# Patient Record
Sex: Male | Born: 1975 | Race: White | Hispanic: No | Smoking: Former smoker
Health system: Southern US, Community
[De-identification: ages and names within clinical notes are randomized; demographics above are authoritative.]

## PROBLEM LIST (undated history)

## (undated) DIAGNOSIS — Z87891 Personal history of nicotine dependence: Secondary | ICD-10-CM

---

## 2013-01-24 ENCOUNTER — Emergency Department (HOSPITAL_COMMUNITY)
Admission: EM | Admit: 2013-01-24 | Discharge: 2013-01-24 | Disposition: A | Discharge status: Home / Self Care | Payer: No Typology Code available for payment source | Attending: Emergency Medicine | Admitting: Emergency Medicine

## 2013-01-24 ENCOUNTER — Encounter (HOSPITAL_COMMUNITY): Payer: Self-pay | Admitting: *Deleted

## 2013-01-24 DIAGNOSIS — R109 Unspecified abdominal pain: Secondary | ICD-10-CM | POA: Insufficient documentation

## 2013-01-24 DIAGNOSIS — R110 Nausea: Secondary | ICD-10-CM | POA: Insufficient documentation

## 2013-01-24 DIAGNOSIS — R61 Generalized hyperhidrosis: Secondary | ICD-10-CM | POA: Insufficient documentation

## 2013-01-24 DIAGNOSIS — R6883 Chills (without fever): Secondary | ICD-10-CM | POA: Insufficient documentation

## 2013-01-24 DIAGNOSIS — I498 Other specified cardiac arrhythmias: Secondary | ICD-10-CM | POA: Insufficient documentation

## 2013-01-24 LAB — CBC WITH DIFFERENTIAL/PLATELET
Eosinophils Absolute: 0.5 10*3/uL (ref 0.0–0.7)
Hemoglobin: 14.9 g/dL (ref 13.0–17.0)
Lymphs Abs: 3.8 10*3/uL (ref 0.7–4.0)
MCH: 29.7 pg (ref 26.0–34.0)
MCV: 87.5 fL (ref 78.0–100.0)
Monocytes Relative: 8 % (ref 3–12)
Neutrophils Relative %: 51 % (ref 43–77)
RBC: 5.02 MIL/uL (ref 4.22–5.81)

## 2013-01-24 LAB — COMPREHENSIVE METABOLIC PANEL
Alkaline Phosphatase: 60 U/L (ref 39–117)
BUN: 16 mg/dL (ref 6–23)
CO2: 28 mEq/L (ref 19–32)
GFR calc Af Amer: >90 mL/min (ref >90)
GFR calc non Af Amer: >90 mL/min (ref >90)
Glucose, Bld: 131 mg/dL — ABNORMAL HIGH (ref 70–99)
Potassium: 4.3 mEq/L (ref 3.5–5.1)
Total Bilirubin: 0.2 mg/dL — ABNORMAL LOW (ref 0.3–1.2)
Total Protein: 7.2 g/dL (ref 6.0–8.3)

## 2013-01-24 LAB — LIPASE, BLOOD: Lipase: 58 U/L (ref 11–59)

## 2013-01-24 MED ORDER — SODIUM CHLORIDE 0.9 % IV BOLUS (SEPSIS): 1000.0000 mL | Freq: Once | INTRAVENOUS | Status: DC

## 2013-01-24 MED ORDER — MORPHINE SULFATE 4 MG/ML IJ SOLN
4.0000 mg | Freq: Once | INTRAMUSCULAR | Status: AC
  Administered 2013-01-24: 4 mg via INTRAVENOUS
  Filled 2013-01-24: qty 1

## 2013-01-24 MED ORDER — SODIUM CHLORIDE 0.9 % IV BOLUS (SEPSIS)
1000.0000 mL | Freq: Once | INTRAVENOUS | Status: AC
  Administered 2013-01-24: 1000 mL via INTRAVENOUS

## 2013-01-24 MED ORDER — ONDANSETRON HCL 4 MG/2ML IJ SOLN
4.0000 mg | Freq: Once | INTRAMUSCULAR | Status: AC
  Administered 2013-01-24: 4 mg via INTRAVENOUS
  Filled 2013-01-24: qty 2

## 2013-01-24 NOTE — ED Notes (Signed)
Patient is alert and oriented x3.  He was given DC instructions and follow up visit instructions.  Patient gave verbal understanding.  He was DC ambulatory under his own power to home.  V/S stable.  He was not showing any signs of distress on DC 

## 2013-01-24 NOTE — ED Provider Notes (Signed)
History     CSN: [id number]  Arrival date & time 01/24/13  4098   First MD Initiated Contact with Patient 01/24/13 0406      Chief Complaint  Patient presents with  . Abdominal Pain   HPI  History provided by the patient. Patient is a 37 year old male with no significant PMH who presents with complaints of epigastric and right upper quadrant pain. Patient states this awoke him from sleep and is sharp and stabbing pains. The pain radiates across the upper abdomen like a "tire". Patient denies having similar symptoms in the past. He denies having any aggravating or alleviating factors. There is associated nausea but no episodes of vomiting. He also reports having chills and sweats. Denies any diarrhea or constipation. No recent illnesses. He is not daily alcohol user but does report having one beer earlier in the evening. No recent travel.   History reviewed. No pertinent past medical history.  History reviewed. No pertinent past surgical history.  History reviewed. No pertinent family history.  History  Substance Use Topics  . Smoking status: Not on file  . Smokeless tobacco: Not on file  . Alcohol Use: Not on file      Review of Systems  Constitutional: Positive for chills and diaphoresis. Negative for fever.  Respiratory: Negative for shortness of breath.   Cardiovascular: Negative for chest pain.  Gastrointestinal: Positive for nausea and abdominal pain. Negative for vomiting, diarrhea and constipation.  All other systems reviewed and are negative.    Allergies  Review of patient's allergies indicates no known allergies.  Home Medications   Current Outpatient Rx  Name  Route  Sig  Dispense  Refill  . bismuth subsalicylate (PEPTO BISMOL) 262 MG/15ML suspension   Oral   Take 15 mLs by mouth every 6 (six) hours as needed for indigestion (stomach).         . Phenylephrine-APAP-Guaifenesin (MUCINEX FAST-MAX COLD & SINUS) 5-325-200 MG TABS   Oral   Take 2 tablets  by mouth every 6 (six) hours as needed (sinus).           BP 129/77  Pulse 41  Temp(Src) 98.3 F (36.8 C) (Oral)  Resp 14  Wt 230 lb (104.327 kg)  SpO2 99%  Physical Exam  Nursing note and vitals reviewed. Constitutional: He is oriented to person, place, and time. He appears well-developed and well-nourished.  HENT:  Head: Normocephalic.  Cardiovascular: Regular rhythm.  Bradycardia present.   Pulmonary/Chest: Effort normal and breath sounds normal. No respiratory distress. He has no wheezes. He has no rales.  Abdominal: Soft. There is tenderness in the right upper quadrant, epigastric area and left upper quadrant. There is positive Murphy's sign. There is no rebound, no guarding and no CVA tenderness.  Musculoskeletal: Normal range of motion.  Neurological: He is alert and oriented to person, place, and time.  Skin: Skin is warm. No rash noted. No erythema. No pallor.  Psychiatric: He has a normal mood and affect. His behavior is normal.    ED Course  Procedures   Results for orders placed during the hospital encounter of 01/24/13  CBC WITH DIFFERENTIAL      Result Value Range   WBC 10.5  4.0 - 10.5 K/uL   RBC 5.02  4.22 - 5.81 MIL/uL   Hemoglobin 14.9  13.0 - 17.0 g/dL   HCT 11.9  14.7 - 82.9 %   MCV 87.5  78.0 - 100.0 fL   MCH 29.7  26.0 - 34.0 pg  MCHC 33.9  30.0 - 36.0 g/dL   RDW 16.1  09.6 - 04.5 %   Platelets 232  150 - 400 K/uL   Neutrophils Relative % 51  43 - 77 %   Neutro Abs 5.3  1.7 - 7.7 K/uL   Lymphocytes Relative 36  12 - 46 %   Lymphs Abs 3.8  0.7 - 4.0 K/uL   Monocytes Relative 8  3 - 12 %   Monocytes Absolute 0.9  0.1 - 1.0 K/uL   Eosinophils Relative 5  0 - 5 %   Eosinophils Absolute 0.5  0.0 - 0.7 K/uL   Basophils Relative 0  0 - 1 %   Basophils Absolute 0.0  0.0 - 0.1 K/uL  COMPREHENSIVE METABOLIC PANEL      Result Value Range   Sodium 139  135 - 145 mEq/L   Potassium 4.3  3.5 - 5.1 mEq/L   Chloride 102  96 - 112 mEq/L   CO2 28  19 - 32  mEq/L   Glucose, Bld 131 (*) 70 - 99 mg/dL   BUN 16  6 - 23 mg/dL   Creatinine, Ser 4.09  0.50 - 1.35 mg/dL   Calcium 9.5  8.4 - 81.1 mg/dL   Total Protein 7.2  6.0 - 8.3 g/dL   Albumin 3.8  3.5 - 5.2 g/dL   AST 31  0 - 37 U/L   ALT 38  0 - 53 U/L   Alkaline Phosphatase 60  39 - 117 U/L   Total Bilirubin 0.2 (*) 0.3 - 1.2 mg/dL   GFR calc non Af Amer >90  >90 mL/min   GFR calc Af Amer >90  >90 mL/min  LIPASE, BLOOD      Result Value Range   Lipase 58  11 - 59 U/L         1. Abdominal pain       MDM  4:00AM patient seen and evaluated. Patient appears uncomfortable does not appear in acute distress. Initially he is hypertensive and also bradycardic, however on recheck this is normalized. Denies chest pain or shortness of breath.   Pain improved after medications.  I discussed with patient options for any additional testing including possibly consider an ultrasound study or a CAT scan. At this time he feels better and with his normal lab testing he prefers to return home. I've given him instructions to have a recheck of symptoms in the next 24-48 hours. He was also given strict return precautions if symptoms worsen.    Date: 01/24/2013  Rate: 42   Rhythm: sinus bradycardia  QRS Axis: normal  Intervals: normal  ST/T Wave abnormalities: normal  Conduction Disutrbances:none  Narrative Interpretation:   Old EKG Reviewed: none available    Angus Seller, PA-C 01/24/13 [phone]

## 2013-01-24 NOTE — ED Provider Notes (Signed)
Medical screening examination/treatment/procedure(s) were performed by non-physician practitioner and as supervising physician I was immediately available for consultation/collaboration.  Flint Melter, MD 01/24/13 [phone]

## 2013-01-24 NOTE — ED Notes (Signed)
Patient is alert and oriented x3.  He is complaining of mid abdominal pain that started suddenly this morning.   He denies any past history of this issue or any other medical issues.  He currently rates his pain at 10 of 10 With nausea.

## 2013-04-10 ENCOUNTER — Emergency Department (HOSPITAL_COMMUNITY): Payer: No Typology Code available for payment source | Admitting: Anesthesiology

## 2013-04-10 ENCOUNTER — Encounter (HOSPITAL_COMMUNITY)
Admission: EM | Disposition: A | Discharge status: Home / Self Care | Payer: Self-pay | Source: Home / Self Care | Attending: Emergency Medicine

## 2013-04-10 ENCOUNTER — Encounter (HOSPITAL_COMMUNITY): Payer: Self-pay

## 2013-04-10 ENCOUNTER — Encounter (HOSPITAL_COMMUNITY): Payer: Self-pay | Admitting: Anesthesiology

## 2013-04-10 ENCOUNTER — Emergency Department (HOSPITAL_COMMUNITY): Payer: No Typology Code available for payment source

## 2013-04-10 ENCOUNTER — Observation Stay (HOSPITAL_COMMUNITY)
Admission: EM | Admit: 2013-04-10 | Discharge: 2013-04-11 | Disposition: A | Discharge status: Home / Self Care | Payer: No Typology Code available for payment source | Attending: General Surgery | Admitting: General Surgery

## 2013-04-10 DIAGNOSIS — K810 Acute cholecystitis: Principal | ICD-10-CM | POA: Insufficient documentation

## 2013-04-10 DIAGNOSIS — K8010 Calculus of gallbladder with chronic cholecystitis without obstruction: Secondary | ICD-10-CM

## 2013-04-10 DIAGNOSIS — R109 Unspecified abdominal pain: Secondary | ICD-10-CM

## 2013-04-10 DIAGNOSIS — K8020 Calculus of gallbladder without cholecystitis without obstruction: Secondary | ICD-10-CM

## 2013-04-10 DIAGNOSIS — R300 Dysuria: Secondary | ICD-10-CM | POA: Insufficient documentation

## 2013-04-10 DIAGNOSIS — K7689 Other specified diseases of liver: Secondary | ICD-10-CM | POA: Insufficient documentation

## 2013-04-10 HISTORY — DX: Personal history of nicotine dependence: Z87.891

## 2013-04-10 HISTORY — PX: CHOLECYSTECTOMY: SHX55

## 2013-04-10 LAB — COMPREHENSIVE METABOLIC PANEL
AST: 19 U/L (ref 0–37)
Albumin: 4.1 g/dL (ref 3.5–5.2)
Alkaline Phosphatase: 56 U/L (ref 39–117)
Chloride: 102 mEq/L (ref 96–112)
Potassium: 4 mEq/L (ref 3.5–5.1)
Total Bilirubin: 0.3 mg/dL (ref 0.3–1.2)
Total Protein: 7.4 g/dL (ref 6.0–8.3)

## 2013-04-10 LAB — CBC WITH DIFFERENTIAL/PLATELET
Basophils Absolute: 0 10*3/uL (ref 0.0–0.1)
Basophils Relative: 0 % (ref 0–1)
Eosinophils Absolute: 0.3 10*3/uL (ref 0.0–0.7)
MCHC: 34.8 g/dL (ref 30.0–36.0)
Neutro Abs: 7.1 10*3/uL (ref 1.7–7.7)
Neutrophils Relative %: 57 % (ref 43–77)
Platelets: 249 10*3/uL (ref 150–400)
RDW: 13.2 % (ref 11.5–15.5)

## 2013-04-10 SURGERY — LAPAROSCOPIC CHOLECYSTECTOMY WITH INTRAOPERATIVE CHOLANGIOGRAM
Anesthesia: General | Site: Abdomen | Wound class: Clean Contaminated

## 2013-04-10 MED ORDER — MIDAZOLAM HCL 5 MG/5ML IJ SOLN
INTRAMUSCULAR | Status: DC | PRN
  Administered 2013-04-10: 2 mg via INTRAVENOUS

## 2013-04-10 MED ORDER — FENTANYL CITRATE 0.05 MG/ML IJ SOLN
INTRAMUSCULAR | Status: DC | PRN
  Administered 2013-04-10 (×2): 50 ug via INTRAVENOUS
  Administered 2013-04-10: 100 ug via INTRAVENOUS
  Administered 2013-04-10: 50 ug via INTRAVENOUS

## 2013-04-10 MED ORDER — SODIUM CHLORIDE 0.9 % IR SOLN
Status: DC | PRN
  Administered 2013-04-10: 1000 mL

## 2013-04-10 MED ORDER — ROCURONIUM BROMIDE 100 MG/10ML IV SOLN
INTRAVENOUS | Status: DC | PRN
  Administered 2013-04-10: 50 mg via INTRAVENOUS
  Administered 2013-04-10: 10 mg via INTRAVENOUS

## 2013-04-10 MED ORDER — NEOSTIGMINE METHYLSULFATE 1 MG/ML IJ SOLN
INTRAMUSCULAR | Status: DC | PRN
  Administered 2013-04-10: 5 mg via INTRAVENOUS

## 2013-04-10 MED ORDER — SODIUM CHLORIDE 0.9 % IV SOLN
INTRAVENOUS | Status: DC | PRN
  Administered 2013-04-10 (×2): via INTRAVENOUS

## 2013-04-10 MED ORDER — GLYCOPYRROLATE 0.2 MG/ML IJ SOLN
INTRAMUSCULAR | Status: DC | PRN
  Administered 2013-04-10: 0.6 mg via INTRAVENOUS

## 2013-04-10 MED ORDER — MORPHINE SULFATE 2 MG/ML IJ SOLN: 1.0000 mg | INTRAMUSCULAR | Status: DC | PRN

## 2013-04-10 MED ORDER — LIDOCAINE-EPINEPHRINE 1 %-1:100000 IJ SOLN
INTRAMUSCULAR | Status: AC
  Filled 2013-04-10: qty 1

## 2013-04-10 MED ORDER — HYDROCODONE-ACETAMINOPHEN 5-325 MG PO TABS
1.0000 | ORAL_TABLET | ORAL | Status: DC | PRN
  Administered 2013-04-10 (×2): 2 via ORAL
  Filled 2013-04-10 (×2): qty 2

## 2013-04-10 MED ORDER — HYDROMORPHONE HCL PF 1 MG/ML IJ SOLN
1.0000 mg | Freq: Once | INTRAMUSCULAR | Status: AC
  Administered 2013-04-10: 1 mg via INTRAMUSCULAR

## 2013-04-10 MED ORDER — IOHEXOL 300 MG/ML  SOLN
INTRAMUSCULAR | Status: DC | PRN
  Administered 2013-04-10: 8 mL via INTRAVENOUS

## 2013-04-10 MED ORDER — IOHEXOL 300 MG/ML  SOLN
INTRAMUSCULAR | Status: AC
  Filled 2013-04-10: qty 1

## 2013-04-10 MED ORDER — ONDANSETRON HCL 4 MG/2ML IJ SOLN: 4.0000 mg | Freq: Four times a day (QID) | INTRAMUSCULAR | Status: DC | PRN

## 2013-04-10 MED ORDER — SODIUM CHLORIDE 0.9 % IV BOLUS (SEPSIS)
1000.0000 mL | Freq: Once | INTRAVENOUS | Status: AC
  Administered 2013-04-10: 1000 mL via INTRAVENOUS

## 2013-04-10 MED ORDER — HYDROMORPHONE HCL PF 1 MG/ML IJ SOLN: 0.2500 mg | INTRAMUSCULAR | Status: DC | PRN

## 2013-04-10 MED ORDER — PROPOFOL 10 MG/ML IV BOLUS
INTRAVENOUS | Status: DC | PRN
  Administered 2013-04-10: 150 mg via INTRAVENOUS

## 2013-04-10 MED ORDER — ONDANSETRON HCL 4 MG/2ML IJ SOLN
INTRAMUSCULAR | Status: DC | PRN
  Administered 2013-04-10: 4 mg via INTRAVENOUS

## 2013-04-10 MED ORDER — ENOXAPARIN SODIUM 40 MG/0.4ML ~~LOC~~ SOLN
40.0000 mg | SUBCUTANEOUS | Status: DC
  Filled 2013-04-10 (×2): qty 0.4

## 2013-04-10 MED ORDER — HYDROCODONE-ACETAMINOPHEN 5-325 MG PO TABS: 1.0000 | ORAL_TABLET | ORAL | Status: AC | PRN

## 2013-04-10 MED ORDER — ONDANSETRON HCL 4 MG PO TABS: 4.0000 mg | ORAL_TABLET | Freq: Four times a day (QID) | ORAL | Status: DC | PRN

## 2013-04-10 MED ORDER — HYDROMORPHONE HCL PF 1 MG/ML IJ SOLN
1.0000 mg | Freq: Once | INTRAMUSCULAR | Status: DC
  Filled 2013-04-10: qty 1

## 2013-04-10 MED ORDER — LIDOCAINE HCL (CARDIAC) 20 MG/ML IV SOLN
INTRAVENOUS | Status: DC | PRN
  Administered 2013-04-10: 50 mg via INTRAVENOUS

## 2013-04-10 MED ORDER — HYDROMORPHONE HCL PF 1 MG/ML IJ SOLN
1.0000 mg | Freq: Once | INTRAMUSCULAR | Status: AC
  Administered 2013-04-10: 1 mg via INTRAVENOUS
  Filled 2013-04-10: qty 1

## 2013-04-10 MED ORDER — LACTATED RINGERS IR SOLN
Status: DC | PRN
  Administered 2013-04-10: 1

## 2013-04-10 MED ORDER — PROMETHAZINE HCL 25 MG/ML IJ SOLN: 6.2500 mg | INTRAMUSCULAR | Status: DC | PRN

## 2013-04-10 MED ORDER — BUPIVACAINE HCL (PF) 0.25 % IJ SOLN
INTRAMUSCULAR | Status: AC
  Filled 2013-04-10: qty 30

## 2013-04-10 MED ORDER — IBUPROFEN 200 MG PO TABS: 600.0000 mg | ORAL_TABLET | Freq: Three times a day (TID) | ORAL | Status: AC | PRN

## 2013-04-10 MED ORDER — GI COCKTAIL ~~LOC~~
30.0000 mL | Freq: Once | ORAL | Status: AC
  Administered 2013-04-10: 30 mL via ORAL
  Filled 2013-04-10: qty 30

## 2013-04-10 MED ORDER — BUPIVACAINE HCL (PF) 0.25 % IJ SOLN
INTRAMUSCULAR | Status: DC | PRN
  Administered 2013-04-10: 15 mL

## 2013-04-10 MED ORDER — SODIUM CHLORIDE 0.9 % IV SOLN
3.0000 g | Freq: Once | INTRAVENOUS | Status: AC
  Administered 2013-04-10: 3 g via INTRAVENOUS
  Filled 2013-04-10: qty 3

## 2013-04-10 MED ORDER — LIDOCAINE-EPINEPHRINE (PF) 1 %-1:200000 IJ SOLN
INTRAMUSCULAR | Status: DC | PRN
  Administered 2013-04-10: 15 mL

## 2013-04-10 SURGICAL SUPPLY — 38 items
APPLICATOR COTTON TIP 6IN STRL (MISCELLANEOUS) IMPLANT
APPLIER CLIP LOGIC TI 5 (MISCELLANEOUS) ×2 IMPLANT
APPLIER CLIP ROT 10 11.4 M/L (STAPLE) ×2
CABLE HIGH FREQUENCY MONO STRZ (ELECTRODE) ×2 IMPLANT
CANISTER SUCTION 2500CC (MISCELLANEOUS) ×2 IMPLANT
CHLORAPREP W/TINT 26ML (MISCELLANEOUS) ×2 IMPLANT
CLIP APPLIE ROT 10 11.4 M/L (STAPLE) ×1 IMPLANT
CLOTH BEACON ORANGE TIMEOUT ST (SAFETY) ×2 IMPLANT
COVER SURGICAL LIGHT HANDLE (MISCELLANEOUS) ×2 IMPLANT
DECANTER SPIKE VIAL GLASS SM (MISCELLANEOUS) ×2 IMPLANT
DERMABOND ADVANCED (GAUZE/BANDAGES/DRESSINGS) ×1
DERMABOND ADVANCED .7 DNX12 (GAUZE/BANDAGES/DRESSINGS) ×1 IMPLANT
DRAPE C-ARM 42X120 X-RAY (DRAPES) ×2 IMPLANT
DRAPE LAPAROSCOPIC ABDOMINAL (DRAPES) ×2 IMPLANT
DRAPE UTILITY XL STRL (DRAPES) ×2 IMPLANT
ELECT REM PT RETURN 9FT ADLT (ELECTROSURGICAL) ×2
ELECTRODE REM PT RTRN 9FT ADLT (ELECTROSURGICAL) ×1 IMPLANT
ENDOLOOP SUT PDS II  0 18 (SUTURE) ×1
ENDOLOOP SUT PDS II 0 18 (SUTURE) ×1 IMPLANT
GLOVE SURG SS PI 7.5 STRL IVOR (GLOVE) ×4 IMPLANT
GOWN STRL NON-REIN LRG LVL3 (GOWN DISPOSABLE) ×2 IMPLANT
GOWN STRL REIN XL XLG (GOWN DISPOSABLE) ×4 IMPLANT
KIT BASIN OR (CUSTOM PROCEDURE TRAY) ×2 IMPLANT
NS IRRIG 1000ML POUR BTL (IV SOLUTION) ×2 IMPLANT
PENCIL BUTTON HOLSTER BLD 10FT (ELECTRODE) ×2 IMPLANT
POUCH SPECIMEN RETRIEVAL 10MM (ENDOMECHANICALS) ×2 IMPLANT
SCISSORS LAP 5X35 DISP (ENDOMECHANICALS) ×2 IMPLANT
SET CHOLANGIOGRAPH MIX (MISCELLANEOUS) ×2 IMPLANT
SET IRRIG TUBING LAPAROSCOPIC (IRRIGATION / IRRIGATOR) ×2 IMPLANT
STRIP CLOSURE SKIN 1/2X4 (GAUZE/BANDAGES/DRESSINGS) IMPLANT
SUT MNCRL AB 4-0 PS2 18 (SUTURE) ×2 IMPLANT
SUT VICRYL 0 UR6 27IN ABS (SUTURE) ×2 IMPLANT
TOWEL OR 17X26 10 PK STRL BLUE (TOWEL DISPOSABLE) ×2 IMPLANT
TRAY LAP CHOLE (CUSTOM PROCEDURE TRAY) ×2 IMPLANT
TROCAR BALLN 12MMX100 BLUNT (TROCAR) ×2 IMPLANT
TROCAR BLADELESS OPT 5 75 (ENDOMECHANICALS) ×4 IMPLANT
TROCAR XCEL NON-BLD 11X100MML (ENDOMECHANICALS) ×2 IMPLANT
TUBING INSUFFLATION 10FT LAP (TUBING) ×2 IMPLANT

## 2013-04-10 NOTE — Brief Op Note (Signed)
04/10/2013  10:32 AM  PATIENT:  [name]  37 y.o. male  PRE-OPERATIVE DIAGNOSIS:  gallstones  POST-OPERATIVE DIAGNOSIS:  gallstones  PROCEDURE:  Procedure(s): LAPAROSCOPIC CHOLECYSTECTOMY WITH INTRAOPERATIVE CHOLANGIOGRAM (N/A)  SURGEON:  Surgeon(s) and Role:    * Lodema Pilot, DO - Primary    * Kandis Cocking, MD - Assisting  PHYSICIAN ASSISTANT:   ASSISTANTSEzzard Standing   ANESTHESIA:   general  EBL:     BLOOD ADMINISTERED:none  DRAINS: none   LOCAL MEDICATIONS USED:  MARCAINE    and LIDOCAINE   SPECIMEN:  Source of Specimen:  gallbladder  DISPOSITION OF SPECIMEN:  PATHOLOGY  COUNTS:  YES  TOURNIQUET:  * No tourniquets in log *  DICTATION: .Other Dictation: Dictation Number [id number]  PLAN OF CARE: Admit for overnight observation  PATIENT DISPOSITION:  PACU - hemodynamically stable.   Delay start of Pharmacological VTE agent (>24hrs) due to surgical blood loss or risk of bleeding: no

## 2013-04-10 NOTE — Transfer of Care (Signed)
Immediate Anesthesia Transfer of Care Note  Patient: [name]  Procedure(s) Performed: Procedure(s): LAPAROSCOPIC CHOLECYSTECTOMY WITH INTRAOPERATIVE CHOLANGIOGRAM (N/A)  Patient Location: PACU  Anesthesia Type:General  Level of Consciousness: awake and alert   Airway & Oxygen Therapy: Patient Spontanous Breathing and Patient connected to face mask oxygen  Post-op Assessment: Report given to PACU RN and Post -op Vital signs reviewed and stable  Post vital signs: Reviewed and stable  Complications: No apparent anesthesia complications

## 2013-04-10 NOTE — H&P (Signed)
Reason for Consult:cholecystitis Referring Physician: Rodolph Hagemann is an 37 y.o. male.  HPI: to evaluate this patient for cholelithiasis and abdominal pain. He was seen in the emergency room several months ago for similar episodes of abdominal pain which seemed to improve after pain medication administration and he was discharged. He has not had any further episodes until last night when he was awakened from sleep with the acute onset of right upper quadrant abdominal pain. He says it is not rotate or radiate to any other location. He had some chills but no fevers and he thought that going to the bathroom would help. He had normal bowel movement without any blood or melena without any relief.  He tried Pepto-Bismol also without any relief.  He has had a few doses of pain medication today in the emergency room and he still has epigastric and right upper quadrant pain. He had an ultrasound which demonstrated a single gallstone without obvious wall thickening or pericholecystic fluid. He denies any NSAID use but does have occasional reflux.  History reviewed. No pertinent past medical history.  History reviewed. No pertinent past surgical history.  History reviewed. No pertinent family history.  Social History:  reports that he does not drink alcohol. His tobacco and drug histories are not on file.  Allergies: No Known Allergies  Medications: I have reviewed the patient's current medications.  Results for orders placed during the hospital encounter of 04/10/13 (from the past 48 hour(s))  CBC WITH DIFFERENTIAL     Status: Abnormal   Collection Time    04/10/13  5:35 AM      Result Value Range   WBC 12.4 (*) 4.0 - 10.5 K/uL   RBC 5.19  4.22 - 5.81 MIL/uL   Hemoglobin 16.1  13.0 - 17.0 g/dL   HCT 29.5  28.4 - 13.2 %   MCV 89.2  78.0 - 100.0 fL   MCH 31.0  26.0 - 34.0 pg   MCHC 34.8  30.0 - 36.0 g/dL   RDW 44.0  10.2 - 72.5 %   Platelets 249  150 - 400 K/uL   Neutrophils  Relative % 57  43 - 77 %   Neutro Abs 7.1  1.7 - 7.7 K/uL   Lymphocytes Relative 30  12 - 46 %   Lymphs Abs 3.8  0.7 - 4.0 K/uL   Monocytes Relative 10  3 - 12 %   Monocytes Absolute 1.2 (*) 0.1 - 1.0 K/uL   Eosinophils Relative 3  0 - 5 %   Eosinophils Absolute 0.3  0.0 - 0.7 K/uL   Basophils Relative 0  0 - 1 %   Basophils Absolute 0.0  0.0 - 0.1 K/uL  COMPREHENSIVE METABOLIC PANEL     Status: Abnormal   Collection Time    04/10/13  5:35 AM      Result Value Range   Sodium 141  135 - 145 mEq/L   Potassium 4.0  3.5 - 5.1 mEq/L   Chloride 102  96 - 112 mEq/L   CO2 30  19 - 32 mEq/L   Glucose, Bld 122 (*) 70 - 99 mg/dL   BUN 17  6 - 23 mg/dL   Creatinine, Ser 3.66  0.50 - 1.35 mg/dL   Calcium 9.8  8.4 - 44.0 mg/dL   Total Protein 7.4  6.0 - 8.3 g/dL   Albumin 4.1  3.5 - 5.2 g/dL   AST 19  0 - 37 U/L   ALT  23  0 - 53 U/L   Alkaline Phosphatase 56  39 - 117 U/L   Total Bilirubin 0.3  0.3 - 1.2 mg/dL   GFR calc non Af Amer 84 (*) >90 mL/min   GFR calc Af Amer >90  >90 mL/min   Comment:            The eGFR has been calculated     using the CKD EPI equation.     This calculation has not been     validated in all clinical     situations.     eGFR's persistently     <90 mL/min signify     possible Chronic Kidney Disease.  LIPASE, BLOOD     Status: Abnormal   Collection Time    04/10/13  5:35 AM      Result Value Range   Lipase 82 (*) 11 - 59 U/L    US Abdomen Complete  04/10/2013   *RADIOLOGY REPORT*  Clinical Data:  Right upper quadrant abdominal pain.  COMPLETE ABDOMINAL ULTRASOUND  Comparison:  No priors.  Findings:  Gallbladder:  1.2 cm echogenic structure with posterior acoustic shadowing in the neck of the gallbladder, compatible with a gallstone.  Gallbladder is only mildly distended, and demonstrates a normal gallbladder wall thickness (2 mm).  No definite pericholecystic fluid.  Per report for the sonographer, the patient did not exhibit a sonographic Murphy's sign on  examination.  Common bile duct:  Normal caliber measuring 5.1 mm in the porta hepatis.  Liver:  Mild diffuse increased echogenicity throughout the hepatic parenchyma, compatible with hepatic steatosis.  No focal cystic or solid hepatic lesions.  No intrahepatic biliary ductal dilatation.  IVC:  Patent throughout its visualized course in the abdomen.  Pancreas:  Head and body of the pancreas were normal in appearance, but the tail cannot be visualized secondary to overlying bowel gas.  Spleen:  Normal size and echotexture without focal parenchymal abnormality.9.5 cm in length.  Right Kidney:  No hydronephrosis.  Well-preserved cortex.  Normal size and parenchymal echotexture without focal abnormalities. 10.6 cm in length.  Left Kidney:  No hydronephrosis.  Well-preserved cortex.  Normal size and parenchymal echotexture without focal abnormalities. 10.8 cm in length.  Abdominal aorta:  2.5 cm in diameter proximally, distally obscured by bowel gas.  IMPRESSION: 1.  Study is positive for cholelithiasis with a 1.2 cm stone in the neck of the gallbladder, however, there are no findings to suggest acute cholecystitis at this time. 2.  Hepatic steatosis.   Original Report Authenticated By: Trudie Reed, M.D.    All other review of systems negative or noncontributory except as stated in the HPI   Blood pressure 162/87, pulse 48, temperature 98.4 F (36.9 C), temperature source Oral, resp. rate 18, height 5\' 6"  (1.676 m), weight 220 lb (99.791 kg), SpO2 100.00%. General appearance: alert, cooperative and no distress Head: Normocephalic, without obvious abnormality, atraumatic Neck: no JVD and supple, symmetrical, trachea midline Resp: clear to auscultation bilaterally Cardio: brady, regular GI: soft, moderate RUQ and epigastric tenderness, ND, no peritoneal signs Extremities: extremities normal, atraumatic, no cyanosis or edema Pulses: 2+ and symmetric Skin: Skin color, texture, turgor normal. No rashes or  lesions Neurologic: Grossly normal  Assessment/Plan: Abdominal pain and cholelithiasis Given his history and his physical and ultrasound, I think that's this is most likely due to symptomatic cholelithiasis or acute cholecystitis. Given the fact that his pain persists after several hours, and concern for acute cholecystitis and I have  offered him cholecystectomy. I did discuss with him the options for pain management and possible elective cholecystectomy or continued observation versus surgery and he would like to have surgery for possible relief of his symptoms. I did discuss with him the procedure and its risks. The risks of infection, bleeding, pain, persistent symptoms, scarring, injury to bowel or bile ducts, retained stone, diarrhea, need for additional procedures, and need for open surgery discussed with the patient.  He expressed understanding and would like to proceed with laparoscopic cholecystectomy with possible open and possible cholangiogram  Anamaria Dusenbury DAVID 04/10/2013, 7:54 AM

## 2013-04-10 NOTE — Anesthesia Preprocedure Evaluation (Signed)
Anesthesia Evaluation  Patient identified by MRN, date of birth, ID band Patient awake    Reviewed: Allergy & Precautions, H&P , NPO status , Patient's Chart, lab work & pertinent test results  Airway Mallampati: II TM Distance: >3 FB Neck ROM: Full    Dental no notable dental hx.    Pulmonary neg pulmonary ROS,  breath sounds clear to auscultation  Pulmonary exam normal       Cardiovascular Exercise Tolerance: Good negative cardio ROS  Rhythm:Regular Rate:Normal     Neuro/Psych negative neurological ROS  negative psych ROS   GI/Hepatic negative GI ROS, Neg liver ROS,   Endo/Other  negative endocrine ROS  Renal/GU negative Renal ROS  negative genitourinary   Musculoskeletal negative musculoskeletal ROS (+)   Abdominal (+) + obese,   Peds negative pediatric ROS (+)  Hematology negative hematology ROS (+)   Anesthesia Other Findings   Reproductive/Obstetrics negative OB ROS                           Anesthesia Physical Anesthesia Plan  ASA: II  Anesthesia Plan: General   Post-op Pain Management:    Induction: Intravenous  Airway Management Planned: Oral ETT  Additional Equipment:   Intra-op Plan:   Post-operative Plan: Extubation in OR  Informed Consent: I have reviewed the patients History and Physical, chart, labs and discussed the procedure including the risks, benefits and alternatives for the proposed anesthesia with the patient or authorized representative who has indicated his/her understanding and acceptance.   Dental advisory given  Plan Discussed with: CRNA  Anesthesia Plan Comments:         Anesthesia Quick Evaluation  

## 2013-04-10 NOTE — Anesthesia Postprocedure Evaluation (Signed)
  Anesthesia Post-op Note  Patient: [name]  Procedure(s) Performed: Procedure(s) (LRB): LAPAROSCOPIC CHOLECYSTECTOMY WITH INTRAOPERATIVE CHOLANGIOGRAM (N/A)  Patient Location: PACU  Anesthesia Type: General  Level of Consciousness: awake and alert   Airway and Oxygen Therapy: Patient Spontanous Breathing  Post-op Pain: mild  Post-op Assessment: Post-op Vital signs reviewed, Patient's Cardiovascular Status Stable, Respiratory Function Stable, Patent Airway and No signs of Nausea or vomiting  Last Vitals:  Filed Vitals:   04/10/13 1144  BP: 157/92  Pulse: 64  Temp: 36.5 C  Resp: 14    Post-op Vital Signs: stable   Complications: No apparent anesthesia complications

## 2013-04-10 NOTE — Progress Notes (Signed)
Patient requested to stay tonight instead of going home due to onset of pain and burning with urination. After discussing that this was okay with the nurse practitioner([place]) earlier , I removed the discharge order.  VSS, no further concerns.

## 2013-04-10 NOTE — Discharge Summary (Signed)
Physician Discharge Summary  Patient ID: [name] MRN: [id number] DOB/AGE: Dec 15, 1975 37 y.o.  Admit date: 04/10/2013 Discharge date: 04/11/13  Admitting Diagnosis: Acute cholecystitis  Discharge Diagnosis Patient Active Problem List   Diagnosis Date Noted  . Symptomatic cholelithiasis 04/10/2013  acute cholecystitis Consultants none  Imaging: Dg Cholangiogram Operative  04/10/2013   *RADIOLOGY REPORT*  Clinical Data: Cholelithiasis  INTRAOPERATIVE CHOLANGIOGRAM  Technique:  Multiple fluoroscopic spot radiographs were obtained during intraoperative cholangiogram and are submitted for interpretation post-operatively.  Comparison: Ultrasound 04/10/2013  Findings: Opacification of the cystic duct and biliary system. There is filling of both the intrahepatic and extrahepatic biliary system.  There are no definite filling defects or stones.  There may be a small amount of air near the biliary confluence.  No significant biliary dilatation.  Contrast drains into the duodenum.  IMPRESSION: Patent biliary system without suspicious filling defects or stones.   Original Report Authenticated By: Richarda Overlie, M.D.   US Abdomen Complete  04/10/2013   *RADIOLOGY REPORT*  Clinical Data:  Right upper quadrant abdominal pain.  COMPLETE ABDOMINAL ULTRASOUND  Comparison:  No priors.  Findings:  Gallbladder:  1.2 cm echogenic structure with posterior acoustic shadowing in the neck of the gallbladder, compatible with a gallstone.  Gallbladder is only mildly distended, and demonstrates a normal gallbladder wall thickness (2 mm).  No definite pericholecystic fluid.  Per report for the sonographer, the patient did not exhibit a sonographic Murphy's sign on examination.  Common bile duct:  Normal caliber measuring 5.1 mm in the porta hepatis.  Liver:  Mild diffuse increased echogenicity throughout the hepatic parenchyma, compatible with hepatic steatosis.  No focal cystic or solid hepatic lesions.  No intrahepatic  biliary ductal dilatation.  IVC:  Patent throughout its visualized course in the abdomen.  Pancreas:  Head and body of the pancreas were normal in appearance, but the tail cannot be visualized secondary to overlying bowel gas.  Spleen:  Normal size and echotexture without focal parenchymal abnormality.9.5 cm in length.  Right Kidney:  No hydronephrosis.  Well-preserved cortex.  Normal size and parenchymal echotexture without focal abnormalities. 10.6 cm in length.  Left Kidney:  No hydronephrosis.  Well-preserved cortex.  Normal size and parenchymal echotexture without focal abnormalities. 10.8 cm in length.  Abdominal aorta:  2.5 cm in diameter proximally, distally obscured by bowel gas.  IMPRESSION: 1.  Study is positive for cholelithiasis with a 1.2 cm stone in the neck of the gallbladder, however, there are no findings to suggest acute cholecystitis at this time. 2.  Hepatic steatosis.   Original Report Authenticated By: Trudie Reed, M.D.    Procedures Laparoscopic cholecystectomy with [facility]  Hospital Course:  [name] is a healthy male who presented to [facility] with RUQ abdominal pain, this was his second ED visit over the last month.  Workup showed gallstones, his symptoms did not improve with pain medication.  Patient was admitted and underwent procedure listed above.  Tolerated procedure well and was transferred to the floor.  Diet was advanced as tolerated.  On POD 1, the patient was voiding well, tolerating diet, ambulating well, pain well controlled, vital signs stable, incisions c/d/i and felt stable for discharge home.  Patient will follow up in our office in 2 weeks and knows to call with questions or concerns.  Physical Exam: General:  Alert, NAD, pleasant, comfortable Abd:  Soft, ND, mild tenderness, incisions C/D/I, drain without any drainage.    Medication List  bismuth subsalicylate 262 MG/15ML suspension  Commonly known as:  PEPTO BISMOL  Take 15 mLs by mouth once.      HYDROcodone-acetaminophen 5-325 MG per tablet  Commonly known as:  NORCO/VICODIN  Take 1 tablet by mouth every 4 (four) hours as needed.     ibuprofen 200 MG tablet  Commonly known as:  MOTRIN IB  Take 3 tablets (600 mg total) by mouth every 8 (eight) hours as needed for pain.             Follow-up Information   Follow up with [facility] On 05/02/2013. (appointment time: 10:30AM.  arrive no later than 10AM.)    Contact information:   [address]   [place] [address] [phone]       Signed: Ashok Norris, [facility] [phone]  04/10/2013, 1:48 PM

## 2013-04-10 NOTE — ED Notes (Signed)
Pt complains of upper abd pain that radiates around to his back, he states that it's not constant but its sharp and wakes him up

## 2013-04-10 NOTE — ED Provider Notes (Signed)
CSN: [id number]     Arrival date & time 04/10/13  0406 History     First MD Initiated Contact with Patient 04/10/13 0450     Chief Complaint  Patient presents with  . Abdominal Pain   (Consider location/radiation/quality/duration/timing/severity/associated sxs/prior Treatment) HPI Comments: Pt is overweight - hx of abd pain a couple of months ago - states that this evening after having a large meal of leftovers he developed epigastric and right upper quadrant pain. This is persistent, severe, associated with radiation to the back but no vomiting or diarrhea. Nothing seems to make this better or worse, no associated sweating, cough, shortness of breath or chest pain. He does recall some burning in his chest earlier in the evening. This is very similar to his prior symptoms at which time he had a normal blood count, cough or has a metabolic panel and lipase. His symptoms improved with medications and no imaging was done at that time.  Patient is a 37 y.o. male presenting with abdominal pain. The history is provided by the patient and medical records.  Abdominal Pain   History reviewed. No pertinent past medical history. History reviewed. No pertinent past surgical history. History reviewed. No pertinent family history. History  Substance Use Topics  . Smoking status: Not on file  . Smokeless tobacco: Not on file  . Alcohol Use: No    Review of Systems  Gastrointestinal: Positive for abdominal pain.  All other systems reviewed and are negative.    Allergies  Review of patient's allergies indicates no known allergies.  Home Medications   Current Outpatient Rx  Name  Route  Sig  Dispense  Refill  . bismuth subsalicylate (PEPTO BISMOL) 262 MG/15ML suspension   Oral   Take 15 mLs by mouth once.           BP 162/87  Pulse 48  Temp(Src) 98.4 F (36.9 C) (Oral)  Resp 18  Ht 5\' 6"  (1.676 m)  Wt 220 lb (99.791 kg)  BMI 35.53 kg/m2  SpO2 100% Physical Exam  Nursing note  and vitals reviewed. Constitutional: He appears well-developed and well-nourished.  Uncomfortable appearing  HENT:  Head: Normocephalic and atraumatic.  Mouth/Throat: Oropharynx is clear and moist. No oropharyngeal exudate.  Eyes: Conjunctivae and EOM are normal. Pupils are equal, round, and reactive to light. Right eye exhibits no discharge. Left eye exhibits no discharge. No scleral icterus.  Neck: Normal range of motion. Neck supple. No JVD present. No thyromegaly present.  Cardiovascular: Normal rate, regular rhythm, normal heart sounds and intact distal pulses.  Exam reveals no gallop and no friction rub.   No murmur heard. Pulmonary/Chest: Effort normal and breath sounds normal. No respiratory distress. He has no wheezes. He has no rales.  Abdominal: Soft. Bowel sounds are normal. He exhibits no distension and no mass. There is tenderness ( Tenderness to the right upper quadrant and epigastrium, mild guarding, no peritoneal signs, no lower abdominal tenderness).  Musculoskeletal: Normal range of motion. He exhibits no edema and no tenderness.  Lymphadenopathy:    He has no cervical adenopathy.  Neurological: He is alert. Coordination normal.  Skin: Skin is warm and dry. No rash noted. No erythema.  Psychiatric: He has a normal mood and affect. His behavior is normal.    ED Course   Procedures (including critical care time)  Labs Reviewed  CBC WITH DIFFERENTIAL - Abnormal; Notable for the following:    WBC 12.4 (*)    Monocytes Absolute 1.2 (*)  All other components within normal limits  COMPREHENSIVE METABOLIC PANEL - Abnormal; Notable for the following:    Glucose, Bld 122 (*)    GFR calc non Af Amer 84 (*)    All other components within normal limits  LIPASE, BLOOD - Abnormal; Notable for the following:    Lipase 82 (*)    All other components within normal limits   US Abdomen Complete  04/10/2013   *RADIOLOGY REPORT*  Clinical Data:  Right upper quadrant abdominal  pain.  COMPLETE ABDOMINAL ULTRASOUND  Comparison:  No priors.  Findings:  Gallbladder:  1.2 cm echogenic structure with posterior acoustic shadowing in the neck of the gallbladder, compatible with a gallstone.  Gallbladder is only mildly distended, and demonstrates a normal gallbladder wall thickness (2 mm).  No definite pericholecystic fluid.  Per report for the sonographer, the patient did not exhibit a sonographic Murphy's sign on examination.  Common bile duct:  Normal caliber measuring 5.1 mm in the porta hepatis.  Liver:  Mild diffuse increased echogenicity throughout the hepatic parenchyma, compatible with hepatic steatosis.  No focal cystic or solid hepatic lesions.  No intrahepatic biliary ductal dilatation.  IVC:  Patent throughout its visualized course in the abdomen.  Pancreas:  Head and body of the pancreas were normal in appearance, but the tail cannot be visualized secondary to overlying bowel gas.  Spleen:  Normal size and echotexture without focal parenchymal abnormality.9.5 cm in length.  Right Kidney:  No hydronephrosis.  Well-preserved cortex.  Normal size and parenchymal echotexture without focal abnormalities. 10.6 cm in length.  Left Kidney:  No hydronephrosis.  Well-preserved cortex.  Normal size and parenchymal echotexture without focal abnormalities. 10.8 cm in length.  Abdominal aorta:  2.5 cm in diameter proximally, distally obscured by bowel gas.  IMPRESSION: 1.  Study is positive for cholelithiasis with a 1.2 cm stone in the neck of the gallbladder, however, there are no findings to suggest acute cholecystitis at this time. 2.  Hepatic steatosis.   Original Report Authenticated By: Trudie Reed, M.D.   1. Acute cholecystitis     MDM  The patient has a upper abdominal pain it could be consistent with cholecystitis, pancreatitis or possible peptic ulcer disease. It does radiate to the back, vital signs are normal except for a bradycardia but of note the patient was found to have  a bradycardia on his prior visit as well. Ultrasound, labs, GI cocktail.  After labs, pt noted to have leukocytosis - he has ongoing RUQ ttp and no improvement with GI cocktail.  He has been given Dilaudid and has no improvement and on reexamination has a + Murphy's sign.    D/w staff working with Dr. Biagio Quint - will come to see pt.  Vida Roller, MD 04/10/13 [phone]

## 2013-04-11 ENCOUNTER — Encounter (HOSPITAL_COMMUNITY): Payer: Self-pay | Admitting: General Surgery

## 2013-04-11 NOTE — Op Note (Signed)
[name], [name] NO.:  [id number]  MEDICAL RECORD NO.:  [id number]  LOCATION:  1540                         FACILITY:  [facility]  PHYSICIAN:  Lodema Pilot, MD       DATE OF BIRTH:  1976/08/14  DATE OF PROCEDURE:  04/10/2013 DATE OF DISCHARGE:                              OPERATIVE REPORT   PROCEDURE:  Laparoscopic cholecystectomy with intraoperative cholangiogram.  PREOPERATIVE DIAGNOSIS:  Acute cholecystitis.  POSTOPERATIVE DIAGNOSIS:  Acute cholecystitis.  SURGEON:  Lodema Pilot, MD  ASSISTANT:  Dr. Ezzard Standing.  ANESTHESIA:  General endotracheal anesthesia with 30 mL of 1% lidocaine with epinephrine and 0.25% Marcaine in a 50:50 mixture.  FLUIDS:  1200 mL of crystalloid.  ESTIMATED BLOOD LOSS:  Minimal.  URINE OUTPUT:  450 mL.  DRAINS:  None.  SPECIMENS:  Gallbladder and contents sent to Pathology for permanent section.  COMPLICATIONS:  None apparent.  INDICATION FOR PROCEDURE:  [name] is a 37 year old male who awoke earlier this morning with acute onset of epigastric and right upper quadrant pain, which has been unrelenting in the emergency room.  His white count was mildly elevated.  He had a gallstone in the infundibulum of the gallbladder.  He has had unrelenting pain, concerning for acute cholecystitis.  I discussed with him the risks and benefits, and the options and he desired to proceed with cholecystectomy.  OPERATIVE DETAILS:  [name] was seen and evaluated in the emergency room and risks and benefits of procedure were discussed in lay terms. Informed consent was obtained.  He was given therapeutic antibiotics and taken to the operating room, placed on table in supine position and general endotracheal anesthesia obtained.  Foley catheter was placed and his abdomen was prepped and draped in the standard surgical fashion. Procedure time-out was performed with all operative team members to confirm proper patient, procedure, and  supraumbilical midline incision was made in the skin and dissection carried down through the subcutaneous tissue using blunt dissection.  The abdominal wall fascia was elevated and sharply incised and the peritoneum entered bluntly.  A 12-mm balloon port was placed at the umbilicus and pneumoperitoneum was obtained.  The laparoscope was introduced and there was no evidence of bleeding or bowel injury.  An epigastric port was placed and two 5-mm right upper quadrant ports were placed under direct visualization.  The gallbladder was identified and noted to be injected and edematous consistent with acute cholecystitis.  The gallbladder was dense and distended and I tried to elevate the gallbladder, however, the grasper just toward the small hole in the gallbladder where some bile leaked out.  There was no need for decompression.  This allowed [place] to grab the gallbladder and retracted cephalad.  He also had a large stone near the cystic duct but this was mobile enough to allow me to grab the gallbladder in this area for retraction.  He did have significant edema in the gallbladder which actually facilitated the dissection in this case.  Using blunt dissection, I was able to skeletonize the cystic artery which was coursing up onto the gallbladder and clips were placed, 2 on the stay side and 1 on the gallbladder  side.  The artery was not divided at this time and I further skeletonized the triangle of Calot and identified the cystic duct.  I opened up the critical view of safety visualizing the liver parenchyma through the triangle of Calot and clip was placed on the gallbladder side and small cystic ductotomy was made. A cholangiogram was performed which demonstrated patent cystic duct and normal right and left hepatic ducts, and normal common bile duct without any filling defects and free flow of bile into the duodenum.  The catheter was removed and the cystic duct was clipped with 2  hemoclips and then placed in addition PDS endo-loop on the cystic duct stumps. The duct was divided as well as the artery which was already PVC clipped and the gallbladder seem to have a fairly decent mesenteric and that coupled with the edema.  Made the dissection of the gallbladder from the gallbladder fossa actually fairly easy as well.  The gallbladder was completely removed from the gallbladder fossa and placed in an EndoCatch bag and was removed from the umbilical trocar site.  He had 2 large gallstones palpable within the gallbladder and was sent to Pathology for permanent sectioning.  I inspected the gallbladder fossa and hemostasis obtained with electrocautery.  Clips appeared to be in good position.  I irrigated the right upper quadrant until the irrigation returned clear. We then suctioned the fluid, and again the clip appear to be in good position.  The gallbladder fossa was hemostatic.  There was no evidence of bleeding or bile or bowel injury.  The right upper quadrant trocars removed under direct visualization and abdominal wall was noted be hemostatic.  The umbilical fascia was approximated with interrupted 0 Vicryl sutures in an open fashion.  Sutures were secured and the abdomen was again reinsufflated with carbon dioxide gas through the epigastric trocar site.  The abdominal wall closure was noted be adequate without any evidence of bleeding or bowel injury.  The right upper quadrant again was noted to be hemostatic without any evidence of bile or bowel injury and cast was removed and the final trocar was removed.  The wounds were injected with a total of 30 mL of 1% lidocaine with epinephrine and 0.25% Marcaine in a 50:50 mixture.  The skin edges were approximated with 4-0 Monocryl subcuticular suture.  Skin was washed and dried and Dermabond was applied.  Foley catheter was removed and all sponge and instrument counts were correct at the end of the case.  The patient  tolerated the procedure well without apparent complications.          ______________________________ Lodema Pilot, MD     BL/MEDQ  D:  04/10/2013  T:  04/11/2013  Job:  [id number]

## 2013-04-11 NOTE — Progress Notes (Signed)
1 Day Post-Op  Subjective: Pain with urination overnight but improving.  Tolerating diet.  No pain meds since last night  Objective: Vital signs in last 24 hours: Temp:  [97.6 F (36.4 C)-98.2 F (36.8 C)] 97.6 F (36.4 C) (08/12 0602) Pulse Rate:  [44-110] 61 (08/12 0602) Resp:  [13-21] 20 (08/12 0602) BP: (132-180)/(74-104) 146/83 mmHg (08/12 0602) SpO2:  [92 %-100 %] 95 % (08/12 0602) Last BM Date: 04/09/13  Intake/Output from previous day: 08/11 0701 - 08/12 0700 In: 1665 [P.O.:240; I.V.:1425] Out: 1100 [Urine:1100] Intake/Output this shift:    General appearance: alert, cooperative and no distress Resp: clear to auscultation bilaterally Cardio: normal rate, regular GI: soft, minimal tenderness, ND, slight erythema/bruising around umbilical incision  Lab Results:   Recent Labs  04/10/13 0535  WBC 12.4*  HGB 16.1  HCT 46.3  PLT 249   BMET  Recent Labs  04/10/13 0535  NA 141  K 4.0  CL 102  CO2 30  GLUCOSE 122*  BUN 17  CREATININE 1.10  CALCIUM 9.8   PT/INR No results found for this basename: LABPROT, INR,  in the last 72 hours ABG No results found for this basename: PHART, PCO2, PO2, HCO3,  in the last 72 hours  Studies/Results: Dg Cholangiogram Operative  04/10/2013   *RADIOLOGY REPORT*  Clinical Data: Cholelithiasis  INTRAOPERATIVE CHOLANGIOGRAM  Technique:  Multiple fluoroscopic spot radiographs were obtained during intraoperative cholangiogram and are submitted for interpretation post-operatively.  Comparison: Ultrasound 04/10/2013  Findings: Opacification of the cystic duct and biliary system. There is filling of both the intrahepatic and extrahepatic biliary system.  There are no definite filling defects or stones.  There may be a small amount of air near the biliary confluence.  No significant biliary dilatation.  Contrast drains into the duodenum.  IMPRESSION: Patent biliary system without suspicious filling defects or stones.   Original Report  Authenticated By: Richarda Overlie, M.D.   US Abdomen Complete  04/10/2013   *RADIOLOGY REPORT*  Clinical Data:  Right upper quadrant abdominal pain.  COMPLETE ABDOMINAL ULTRASOUND  Comparison:  No priors.  Findings:  Gallbladder:  1.2 cm echogenic structure with posterior acoustic shadowing in the neck of the gallbladder, compatible with a gallstone.  Gallbladder is only mildly distended, and demonstrates a normal gallbladder wall thickness (2 mm).  No definite pericholecystic fluid.  Per report for the sonographer, the patient did not exhibit a sonographic Murphy's sign on examination.  Common bile duct:  Normal caliber measuring 5.1 mm in the porta hepatis.  Liver:  Mild diffuse increased echogenicity throughout the hepatic parenchyma, compatible with hepatic steatosis.  No focal cystic or solid hepatic lesions.  No intrahepatic biliary ductal dilatation.  IVC:  Patent throughout its visualized course in the abdomen.  Pancreas:  Head and body of the pancreas were normal in appearance, but the tail cannot be visualized secondary to overlying bowel gas.  Spleen:  Normal size and echotexture without focal parenchymal abnormality.9.5 cm in length.  Right Kidney:  No hydronephrosis.  Well-preserved cortex.  Normal size and parenchymal echotexture without focal abnormalities. 10.6 cm in length.  Left Kidney:  No hydronephrosis.  Well-preserved cortex.  Normal size and parenchymal echotexture without focal abnormalities. 10.8 cm in length.  Abdominal aorta:  2.5 cm in diameter proximally, distally obscured by bowel gas.  IMPRESSION: 1.  Study is positive for cholelithiasis with a 1.2 cm stone in the neck of the gallbladder, however, there are no findings to suggest acute cholecystitis at this time.  2.  Hepatic steatosis.   Original Report Authenticated By: Trudie Reed, M.D.    Anti-infectives: Anti-infectives   Start     Dose/Rate Route Frequency Ordered Stop   04/10/13 0700  [MAR Hold]  Ampicillin-Sulbactam  (UNASYN) 3 g in sodium chloride 0.9 % 100 mL IVPB     (On MAR Hold since 04/10/13 0830)   3 g 100 mL/hr over 60 Minutes Intravenous  Once 04/10/13 1610 04/10/13 0839      Assessment/Plan: s/p Procedure(s): LAPAROSCOPIC CHOLECYSTECTOMY WITH INTRAOPERATIVE CHOLANGIOGRAM (N/A) He looks good.  no evidence of postop complication.  tolerating diet and minimal pain.  voiding well and discomfort from foley getting better as well.  He has slight erythema around umbilicus and I instructed the patient and the mother to call us if this worsened or did not improve in the next 1-2 days or if he had increased pain or fevers.  should be okay for discharge to home.  LOS: 1 day    Lodema Pilot DAVID 04/11/2013

## 2013-04-25 ENCOUNTER — Encounter (INDEPENDENT_AMBULATORY_CARE_PROVIDER_SITE_OTHER): Payer: No Typology Code available for payment source

## 2013-05-02 ENCOUNTER — Encounter (INDEPENDENT_AMBULATORY_CARE_PROVIDER_SITE_OTHER): Payer: Self-pay | Admitting: Internal Medicine

## 2013-05-02 ENCOUNTER — Ambulatory Visit (INDEPENDENT_AMBULATORY_CARE_PROVIDER_SITE_OTHER): Payer: No Typology Code available for payment source | Admitting: Internal Medicine

## 2013-05-02 VITALS — BP 130/82 | HR 62 | Resp 14 | Ht 66.0 in | Wt 229.8 lb

## 2013-05-02 DIAGNOSIS — K8020 Calculus of gallbladder without cholecystitis without obstruction: Secondary | ICD-10-CM

## 2013-05-02 NOTE — Progress Notes (Signed)
  Subjective: Pt returns to the clinic today after undergoing laparoscopic cholecystectomy on 04/10/13 by Dr. Biagio Quint.  The patient is tolerating their diet well and is having no severe pain.  Bowel function is good.  No problems with the wounds.  Objective: Vital signs in last 24 hours: Reviewed  PE: Abd: soft, non-tender, +bs, incisions well healed  Lab Results:  No results found for this basename: WBC, HGB, HCT, PLT,  in the last 72 hours BMET No results found for this basename: NA, K, CL, CO2, GLUCOSE, BUN, CREATININE, CALCIUM,  in the last 72 hours PT/INR No results found for this basename: LABPROT, INR,  in the last 72 hours CMP     Component Value Date/Time   NA 141 04/10/2013 0535   K 4.0 04/10/2013 0535   CL 102 04/10/2013 0535   CO2 30 04/10/2013 0535   GLUCOSE 122* 04/10/2013 0535   BUN 17 04/10/2013 0535   CREATININE 1.10 04/10/2013 0535   CALCIUM 9.8 04/10/2013 0535   PROT 7.4 04/10/2013 0535   ALBUMIN 4.1 04/10/2013 0535   AST 19 04/10/2013 0535   ALT 23 04/10/2013 0535   ALKPHOS 56 04/10/2013 0535   BILITOT 0.3 04/10/2013 0535   GFRNONAA 84* 04/10/2013 0535   GFRAA >90 04/10/2013 0535   Lipase     Component Value Date/Time   LIPASE 82* 04/10/2013 0535       Studies/Results: No results found.  Anti-infectives: Anti-infectives   None       Assessment/Plan  1.  S/P Laparoscopic Cholecystectomy: doing well, may resume regular activity without restrictions, Pt will follow up with [place] PRN and knows to call with questions or concerns.     [name], [name] 05/02/2013

## 2013-05-02 NOTE — Patient Instructions (Signed)
May resume regular activity without restrictions. Follow up as needed. Call with questions or concerns.  

## 2013-08-14 IMAGING — US US ABDOMEN COMPLETE
1 series · 13 of 25 positions shown · non-contrast
Comparison: No priors.

CLINICAL DATA: Right upper quadrant abdominal pain.

COMPLETE ABDOMINAL ULTRASOUND

[Series 1: us abdomen complete · 0.37mm/px · 13 of 63 slices shown]
[im 1/63]
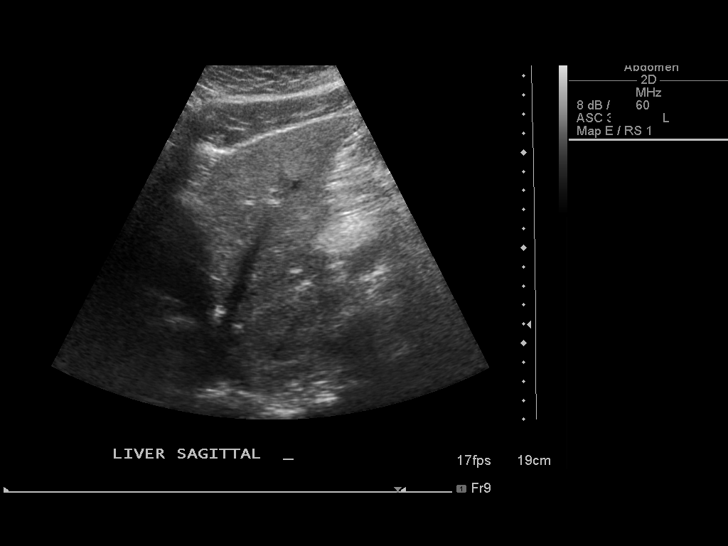
[im 6/63]
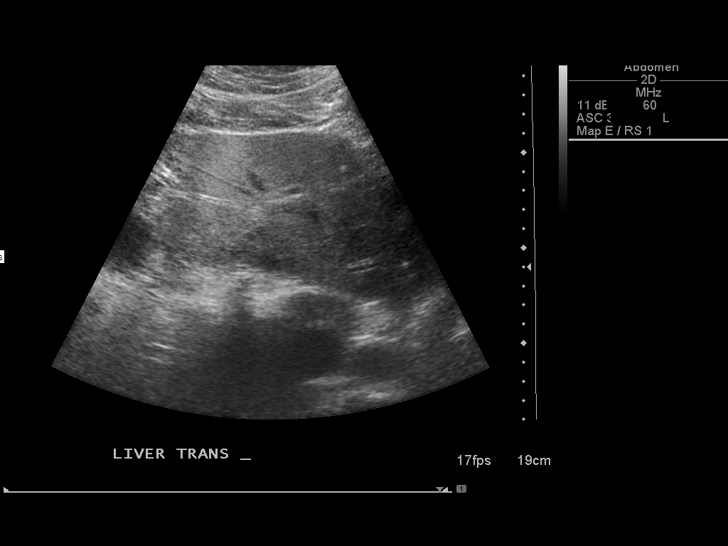
[im 11/63]
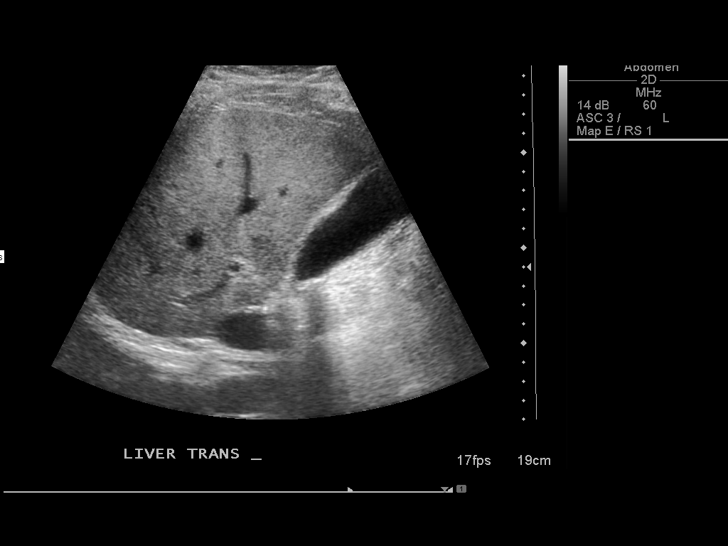
[im 16/63]
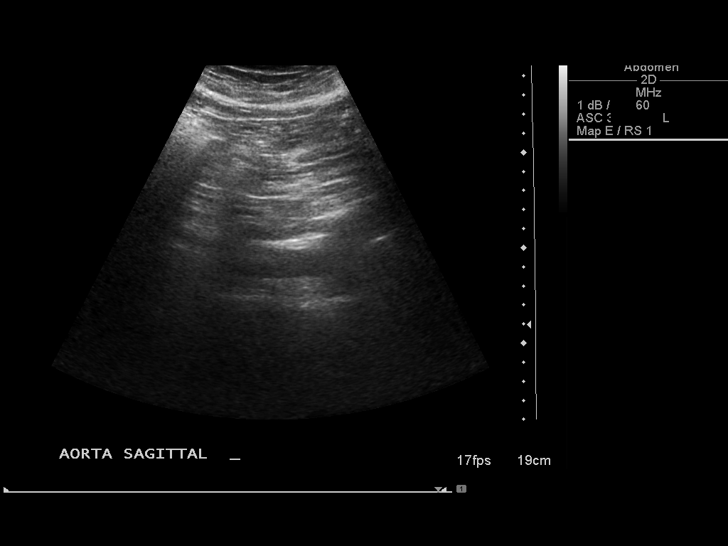
[im 21/63]
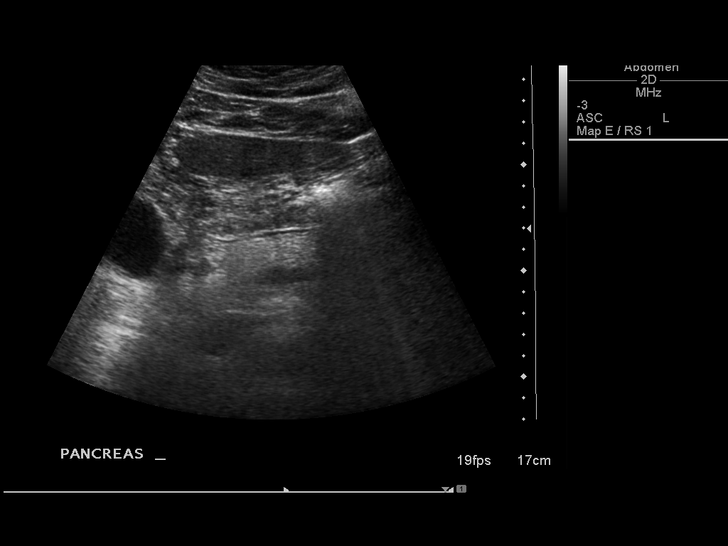
[im 26/63]
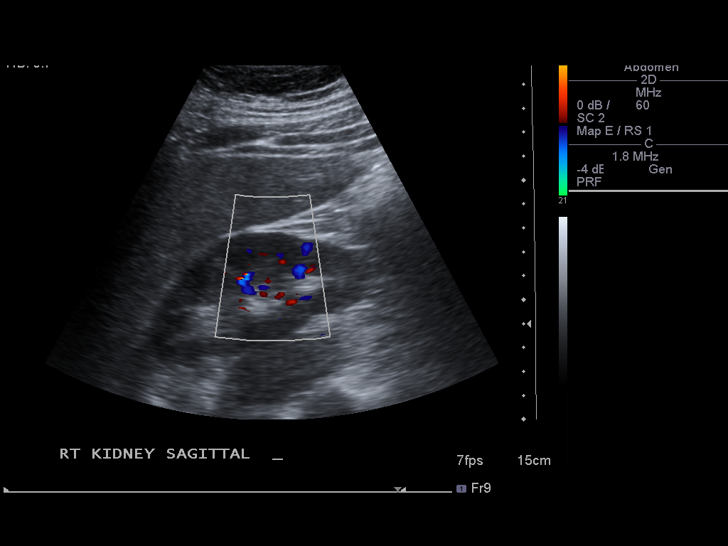
[im 32/63]
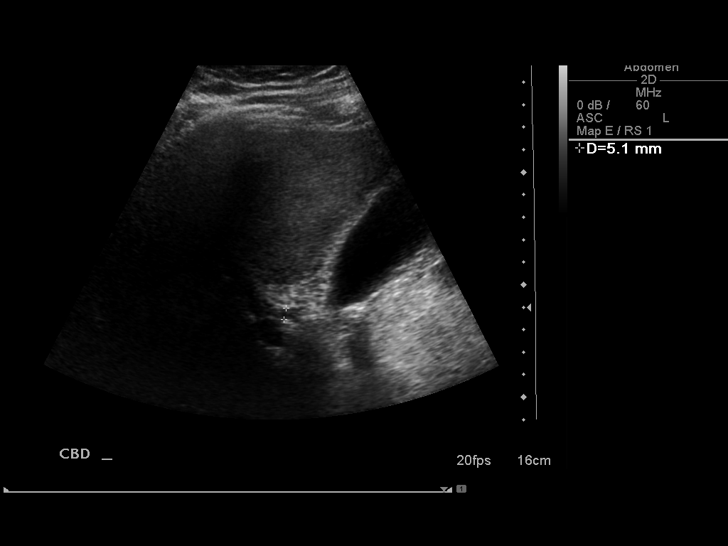
[im 37/63]
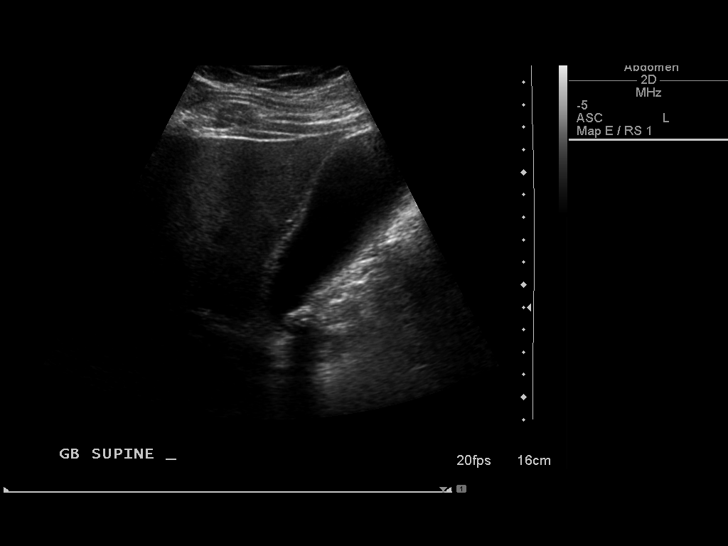
[im 42/63]
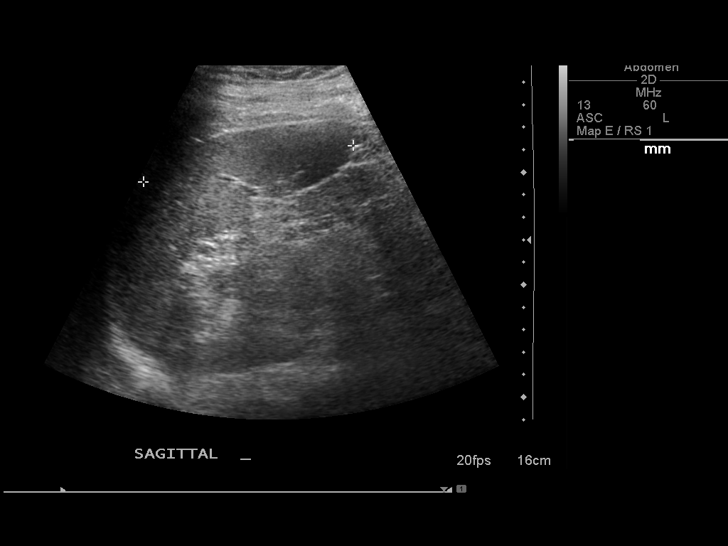
[im 47/63]
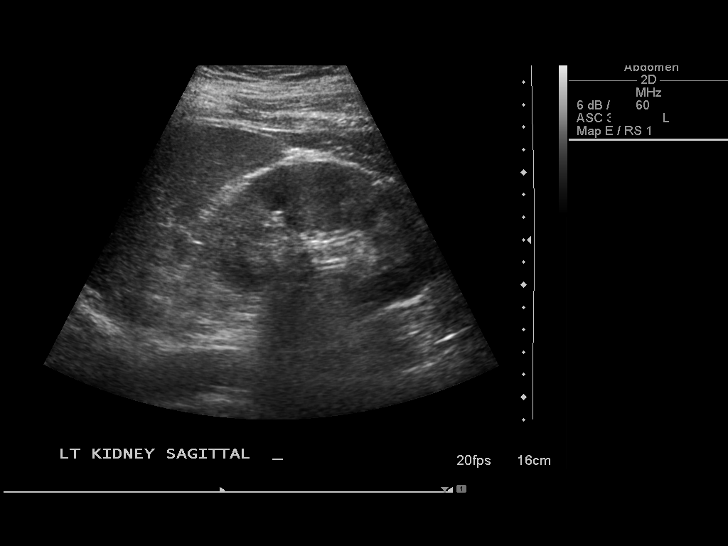
[im 52/63]
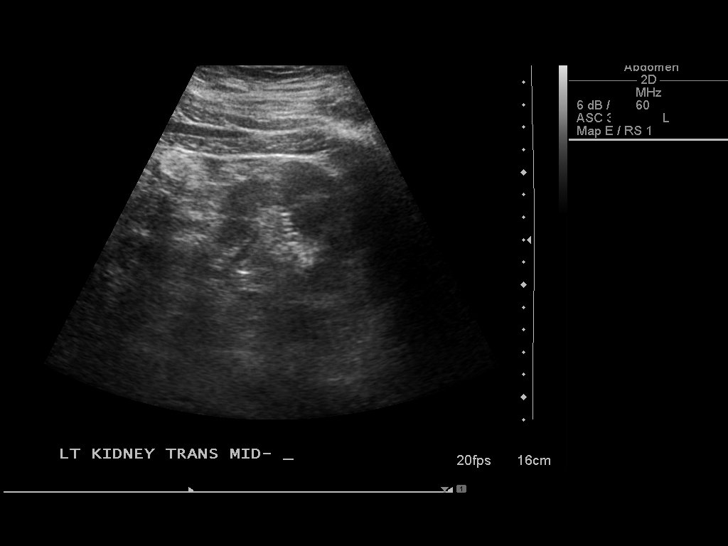
[im 57/63]
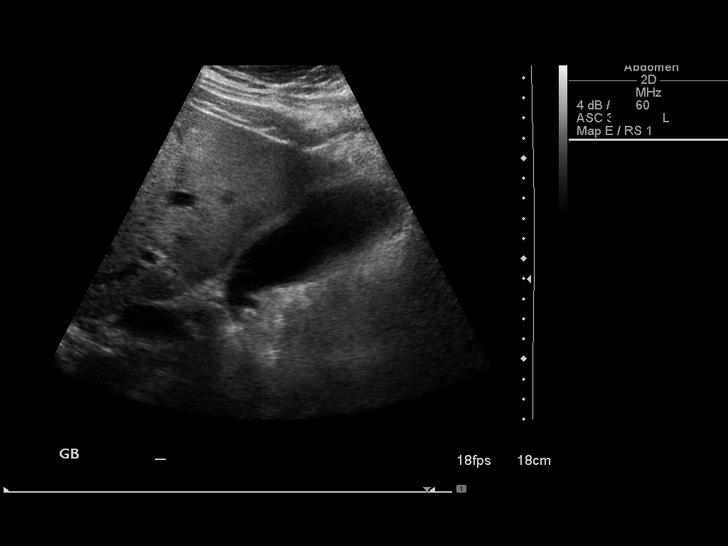
[im 63/63]
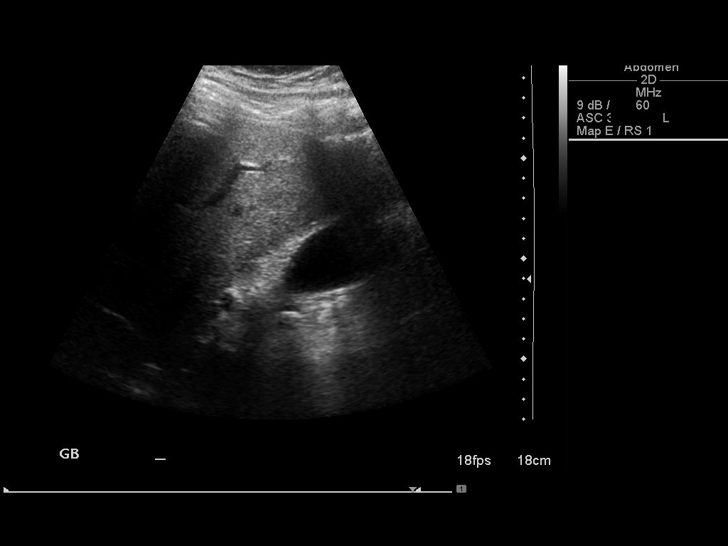

[13 of 25 positions shown; findings below may reference images not displayed]

FINDINGS: Gallbladder:  1.2 cm echogenic structure with posterior acoustic
shadowing in the neck of the gallbladder, compatible with a
gallstone.  Gallbladder is only mildly distended, and demonstrates
a normal gallbladder wall thickness (2 mm).  No definite
pericholecystic fluid.  Per report for the sonographer, the patient
did not exhibit a sonographic Murphy's sign on examination.

Common bile duct:  Normal caliber measuring 5.1 mm in the porta
hepatis.

Liver:  Mild diffuse increased echogenicity throughout the hepatic
parenchyma, compatible with hepatic steatosis.  No focal cystic or
solid hepatic lesions.  No intrahepatic biliary ductal dilatation.

IVC:  Patent throughout its visualized course in the abdomen.

Pancreas:  Head and body of the pancreas were normal in appearance,
but the tail cannot be visualized secondary to overlying bowel gas.

Spleen:  Normal size and echotexture without focal parenchymal
abnormality.9.5 cm in length.

Right Kidney:  No hydronephrosis.  Well-preserved cortex.  Normal
size and parenchymal echotexture without focal abnormalities.
cm in length.

Left Kidney:  No hydronephrosis.  Well-preserved cortex.  Normal
size and parenchymal echotexture without focal abnormalities.
cm in length.

Abdominal aorta:  2.5 cm in diameter proximally, distally obscured
by bowel gas.
IMPRESSION: 1.  Study is positive for cholelithiasis with a 1.2 cm stone in the
neck of the gallbladder, however, there are no findings to suggest
acute cholecystitis at this time.
2.  Hepatic steatosis.

## 2019-02-16 DIAGNOSIS — Z125 Encounter for screening for malignant neoplasm of prostate: Secondary | ICD-10-CM | POA: Diagnosis not present

## 2019-02-16 DIAGNOSIS — Z131 Encounter for screening for diabetes mellitus: Secondary | ICD-10-CM | POA: Diagnosis not present

## 2019-02-16 DIAGNOSIS — Z87891 Personal history of nicotine dependence: Secondary | ICD-10-CM | POA: Diagnosis not present

## 2019-02-16 DIAGNOSIS — Z13220 Encounter for screening for lipoid disorders: Secondary | ICD-10-CM | POA: Diagnosis not present

## 2019-02-16 DIAGNOSIS — Z23 Encounter for immunization: Secondary | ICD-10-CM | POA: Diagnosis not present

## 2019-02-16 DIAGNOSIS — Z6835 Body mass index (BMI) 35.0-35.9, adult: Secondary | ICD-10-CM | POA: Diagnosis not present

## 2019-02-16 DIAGNOSIS — Z0000 Encounter for general adult medical examination without abnormal findings: Secondary | ICD-10-CM | POA: Diagnosis not present

## 2020-02-19 DIAGNOSIS — E785 Hyperlipidemia, unspecified: Secondary | ICD-10-CM | POA: Diagnosis not present

## 2020-02-19 DIAGNOSIS — E781 Pure hyperglyceridemia: Secondary | ICD-10-CM | POA: Diagnosis not present

## 2020-02-19 DIAGNOSIS — Z6838 Body mass index (BMI) 38.0-38.9, adult: Secondary | ICD-10-CM | POA: Diagnosis not present

## 2020-02-19 DIAGNOSIS — R030 Elevated blood-pressure reading, without diagnosis of hypertension: Secondary | ICD-10-CM | POA: Diagnosis not present

## 2020-02-19 DIAGNOSIS — Z0000 Encounter for general adult medical examination without abnormal findings: Secondary | ICD-10-CM | POA: Diagnosis not present

## 2020-03-07 DIAGNOSIS — M5136 Other intervertebral disc degeneration, lumbar region: Secondary | ICD-10-CM | POA: Diagnosis not present

## 2020-03-07 DIAGNOSIS — M9903 Segmental and somatic dysfunction of lumbar region: Secondary | ICD-10-CM | POA: Diagnosis not present

## 2020-03-07 DIAGNOSIS — M5137 Other intervertebral disc degeneration, lumbosacral region: Secondary | ICD-10-CM | POA: Diagnosis not present

## 2020-03-07 DIAGNOSIS — M5116 Intervertebral disc disorders with radiculopathy, lumbar region: Secondary | ICD-10-CM | POA: Diagnosis not present

## 2020-03-12 DIAGNOSIS — M5116 Intervertebral disc disorders with radiculopathy, lumbar region: Secondary | ICD-10-CM | POA: Diagnosis not present

## 2020-03-12 DIAGNOSIS — M5136 Other intervertebral disc degeneration, lumbar region: Secondary | ICD-10-CM | POA: Diagnosis not present

## 2020-03-12 DIAGNOSIS — M5137 Other intervertebral disc degeneration, lumbosacral region: Secondary | ICD-10-CM | POA: Diagnosis not present

## 2020-03-12 DIAGNOSIS — M9903 Segmental and somatic dysfunction of lumbar region: Secondary | ICD-10-CM | POA: Diagnosis not present

## 2020-03-14 DIAGNOSIS — M9903 Segmental and somatic dysfunction of lumbar region: Secondary | ICD-10-CM | POA: Diagnosis not present

## 2020-03-14 DIAGNOSIS — M5136 Other intervertebral disc degeneration, lumbar region: Secondary | ICD-10-CM | POA: Diagnosis not present

## 2020-03-14 DIAGNOSIS — M5137 Other intervertebral disc degeneration, lumbosacral region: Secondary | ICD-10-CM | POA: Diagnosis not present

## 2020-03-14 DIAGNOSIS — M5116 Intervertebral disc disorders with radiculopathy, lumbar region: Secondary | ICD-10-CM | POA: Diagnosis not present

## 2020-03-18 DIAGNOSIS — M5137 Other intervertebral disc degeneration, lumbosacral region: Secondary | ICD-10-CM | POA: Diagnosis not present

## 2020-03-18 DIAGNOSIS — M5136 Other intervertebral disc degeneration, lumbar region: Secondary | ICD-10-CM | POA: Diagnosis not present

## 2020-03-18 DIAGNOSIS — M5116 Intervertebral disc disorders with radiculopathy, lumbar region: Secondary | ICD-10-CM | POA: Diagnosis not present

## 2020-03-18 DIAGNOSIS — M9903 Segmental and somatic dysfunction of lumbar region: Secondary | ICD-10-CM | POA: Diagnosis not present

## 2020-03-19 DIAGNOSIS — M5116 Intervertebral disc disorders with radiculopathy, lumbar region: Secondary | ICD-10-CM | POA: Diagnosis not present

## 2020-03-19 DIAGNOSIS — M5137 Other intervertebral disc degeneration, lumbosacral region: Secondary | ICD-10-CM | POA: Diagnosis not present

## 2020-03-19 DIAGNOSIS — M9903 Segmental and somatic dysfunction of lumbar region: Secondary | ICD-10-CM | POA: Diagnosis not present

## 2020-03-19 DIAGNOSIS — M5136 Other intervertebral disc degeneration, lumbar region: Secondary | ICD-10-CM | POA: Diagnosis not present

## 2020-03-21 DIAGNOSIS — M9903 Segmental and somatic dysfunction of lumbar region: Secondary | ICD-10-CM | POA: Diagnosis not present

## 2020-03-21 DIAGNOSIS — M5136 Other intervertebral disc degeneration, lumbar region: Secondary | ICD-10-CM | POA: Diagnosis not present

## 2020-03-21 DIAGNOSIS — M5116 Intervertebral disc disorders with radiculopathy, lumbar region: Secondary | ICD-10-CM | POA: Diagnosis not present

## 2020-03-21 DIAGNOSIS — M5137 Other intervertebral disc degeneration, lumbosacral region: Secondary | ICD-10-CM | POA: Diagnosis not present

## 2020-03-25 DIAGNOSIS — M9903 Segmental and somatic dysfunction of lumbar region: Secondary | ICD-10-CM | POA: Diagnosis not present

## 2020-03-25 DIAGNOSIS — M5137 Other intervertebral disc degeneration, lumbosacral region: Secondary | ICD-10-CM | POA: Diagnosis not present

## 2020-03-25 DIAGNOSIS — M5136 Other intervertebral disc degeneration, lumbar region: Secondary | ICD-10-CM | POA: Diagnosis not present

## 2020-03-25 DIAGNOSIS — M5116 Intervertebral disc disorders with radiculopathy, lumbar region: Secondary | ICD-10-CM | POA: Diagnosis not present

## 2020-03-26 DIAGNOSIS — M5116 Intervertebral disc disorders with radiculopathy, lumbar region: Secondary | ICD-10-CM | POA: Diagnosis not present

## 2020-03-26 DIAGNOSIS — M9903 Segmental and somatic dysfunction of lumbar region: Secondary | ICD-10-CM | POA: Diagnosis not present

## 2020-03-26 DIAGNOSIS — M5136 Other intervertebral disc degeneration, lumbar region: Secondary | ICD-10-CM | POA: Diagnosis not present

## 2020-03-26 DIAGNOSIS — M5137 Other intervertebral disc degeneration, lumbosacral region: Secondary | ICD-10-CM | POA: Diagnosis not present

## 2020-03-28 DIAGNOSIS — M5136 Other intervertebral disc degeneration, lumbar region: Secondary | ICD-10-CM | POA: Diagnosis not present

## 2020-03-28 DIAGNOSIS — M5137 Other intervertebral disc degeneration, lumbosacral region: Secondary | ICD-10-CM | POA: Diagnosis not present

## 2020-03-28 DIAGNOSIS — M9903 Segmental and somatic dysfunction of lumbar region: Secondary | ICD-10-CM | POA: Diagnosis not present

## 2020-03-28 DIAGNOSIS — M5116 Intervertebral disc disorders with radiculopathy, lumbar region: Secondary | ICD-10-CM | POA: Diagnosis not present

## 2020-04-01 DIAGNOSIS — M9903 Segmental and somatic dysfunction of lumbar region: Secondary | ICD-10-CM | POA: Diagnosis not present

## 2020-04-01 DIAGNOSIS — M5137 Other intervertebral disc degeneration, lumbosacral region: Secondary | ICD-10-CM | POA: Diagnosis not present

## 2020-04-01 DIAGNOSIS — M5116 Intervertebral disc disorders with radiculopathy, lumbar region: Secondary | ICD-10-CM | POA: Diagnosis not present

## 2020-04-01 DIAGNOSIS — M5136 Other intervertebral disc degeneration, lumbar region: Secondary | ICD-10-CM | POA: Diagnosis not present

## 2020-04-04 DIAGNOSIS — M5116 Intervertebral disc disorders with radiculopathy, lumbar region: Secondary | ICD-10-CM | POA: Diagnosis not present

## 2020-04-04 DIAGNOSIS — M5136 Other intervertebral disc degeneration, lumbar region: Secondary | ICD-10-CM | POA: Diagnosis not present

## 2020-04-04 DIAGNOSIS — M9903 Segmental and somatic dysfunction of lumbar region: Secondary | ICD-10-CM | POA: Diagnosis not present

## 2020-04-04 DIAGNOSIS — M5137 Other intervertebral disc degeneration, lumbosacral region: Secondary | ICD-10-CM | POA: Diagnosis not present

## 2020-04-08 DIAGNOSIS — M5136 Other intervertebral disc degeneration, lumbar region: Secondary | ICD-10-CM | POA: Diagnosis not present

## 2020-04-08 DIAGNOSIS — M5137 Other intervertebral disc degeneration, lumbosacral region: Secondary | ICD-10-CM | POA: Diagnosis not present

## 2020-04-08 DIAGNOSIS — M5116 Intervertebral disc disorders with radiculopathy, lumbar region: Secondary | ICD-10-CM | POA: Diagnosis not present

## 2020-04-08 DIAGNOSIS — M9903 Segmental and somatic dysfunction of lumbar region: Secondary | ICD-10-CM | POA: Diagnosis not present

## 2020-04-11 DIAGNOSIS — M5116 Intervertebral disc disorders with radiculopathy, lumbar region: Secondary | ICD-10-CM | POA: Diagnosis not present

## 2020-04-11 DIAGNOSIS — M5137 Other intervertebral disc degeneration, lumbosacral region: Secondary | ICD-10-CM | POA: Diagnosis not present

## 2020-04-11 DIAGNOSIS — M9903 Segmental and somatic dysfunction of lumbar region: Secondary | ICD-10-CM | POA: Diagnosis not present

## 2020-04-11 DIAGNOSIS — M5136 Other intervertebral disc degeneration, lumbar region: Secondary | ICD-10-CM | POA: Diagnosis not present

## 2020-04-16 DIAGNOSIS — M5116 Intervertebral disc disorders with radiculopathy, lumbar region: Secondary | ICD-10-CM | POA: Diagnosis not present

## 2020-04-16 DIAGNOSIS — M5137 Other intervertebral disc degeneration, lumbosacral region: Secondary | ICD-10-CM | POA: Diagnosis not present

## 2020-04-16 DIAGNOSIS — M9903 Segmental and somatic dysfunction of lumbar region: Secondary | ICD-10-CM | POA: Diagnosis not present

## 2020-04-16 DIAGNOSIS — M5136 Other intervertebral disc degeneration, lumbar region: Secondary | ICD-10-CM | POA: Diagnosis not present

## 2020-04-18 DIAGNOSIS — M5136 Other intervertebral disc degeneration, lumbar region: Secondary | ICD-10-CM | POA: Diagnosis not present

## 2020-04-18 DIAGNOSIS — M5116 Intervertebral disc disorders with radiculopathy, lumbar region: Secondary | ICD-10-CM | POA: Diagnosis not present

## 2020-04-18 DIAGNOSIS — M9903 Segmental and somatic dysfunction of lumbar region: Secondary | ICD-10-CM | POA: Diagnosis not present

## 2020-04-18 DIAGNOSIS — M5137 Other intervertebral disc degeneration, lumbosacral region: Secondary | ICD-10-CM | POA: Diagnosis not present

## 2020-04-23 DIAGNOSIS — M5116 Intervertebral disc disorders with radiculopathy, lumbar region: Secondary | ICD-10-CM | POA: Diagnosis not present

## 2020-04-23 DIAGNOSIS — M9903 Segmental and somatic dysfunction of lumbar region: Secondary | ICD-10-CM | POA: Diagnosis not present

## 2020-04-23 DIAGNOSIS — M5136 Other intervertebral disc degeneration, lumbar region: Secondary | ICD-10-CM | POA: Diagnosis not present

## 2020-04-23 DIAGNOSIS — M5137 Other intervertebral disc degeneration, lumbosacral region: Secondary | ICD-10-CM | POA: Diagnosis not present

## 2020-04-25 DIAGNOSIS — M9903 Segmental and somatic dysfunction of lumbar region: Secondary | ICD-10-CM | POA: Diagnosis not present

## 2020-04-25 DIAGNOSIS — M5136 Other intervertebral disc degeneration, lumbar region: Secondary | ICD-10-CM | POA: Diagnosis not present

## 2020-04-25 DIAGNOSIS — M5116 Intervertebral disc disorders with radiculopathy, lumbar region: Secondary | ICD-10-CM | POA: Diagnosis not present

## 2020-04-25 DIAGNOSIS — M5137 Other intervertebral disc degeneration, lumbosacral region: Secondary | ICD-10-CM | POA: Diagnosis not present

## 2020-04-29 DIAGNOSIS — M5136 Other intervertebral disc degeneration, lumbar region: Secondary | ICD-10-CM | POA: Diagnosis not present

## 2020-04-29 DIAGNOSIS — M9903 Segmental and somatic dysfunction of lumbar region: Secondary | ICD-10-CM | POA: Diagnosis not present

## 2020-04-29 DIAGNOSIS — M5116 Intervertebral disc disorders with radiculopathy, lumbar region: Secondary | ICD-10-CM | POA: Diagnosis not present

## 2020-04-29 DIAGNOSIS — M5137 Other intervertebral disc degeneration, lumbosacral region: Secondary | ICD-10-CM | POA: Diagnosis not present

## 2020-05-02 DIAGNOSIS — M9903 Segmental and somatic dysfunction of lumbar region: Secondary | ICD-10-CM | POA: Diagnosis not present

## 2020-05-02 DIAGNOSIS — M5137 Other intervertebral disc degeneration, lumbosacral region: Secondary | ICD-10-CM | POA: Diagnosis not present

## 2020-05-02 DIAGNOSIS — M5116 Intervertebral disc disorders with radiculopathy, lumbar region: Secondary | ICD-10-CM | POA: Diagnosis not present

## 2020-05-02 DIAGNOSIS — M5136 Other intervertebral disc degeneration, lumbar region: Secondary | ICD-10-CM | POA: Diagnosis not present

## 2020-05-07 DIAGNOSIS — M5137 Other intervertebral disc degeneration, lumbosacral region: Secondary | ICD-10-CM | POA: Diagnosis not present

## 2020-05-07 DIAGNOSIS — M5116 Intervertebral disc disorders with radiculopathy, lumbar region: Secondary | ICD-10-CM | POA: Diagnosis not present

## 2020-05-07 DIAGNOSIS — M5136 Other intervertebral disc degeneration, lumbar region: Secondary | ICD-10-CM | POA: Diagnosis not present

## 2020-05-07 DIAGNOSIS — M9903 Segmental and somatic dysfunction of lumbar region: Secondary | ICD-10-CM | POA: Diagnosis not present

## 2020-05-09 DIAGNOSIS — M5137 Other intervertebral disc degeneration, lumbosacral region: Secondary | ICD-10-CM | POA: Diagnosis not present

## 2020-05-09 DIAGNOSIS — M5116 Intervertebral disc disorders with radiculopathy, lumbar region: Secondary | ICD-10-CM | POA: Diagnosis not present

## 2020-05-09 DIAGNOSIS — M9903 Segmental and somatic dysfunction of lumbar region: Secondary | ICD-10-CM | POA: Diagnosis not present

## 2020-05-09 DIAGNOSIS — M5136 Other intervertebral disc degeneration, lumbar region: Secondary | ICD-10-CM | POA: Diagnosis not present

## 2020-05-13 DIAGNOSIS — M5116 Intervertebral disc disorders with radiculopathy, lumbar region: Secondary | ICD-10-CM | POA: Diagnosis not present

## 2020-05-13 DIAGNOSIS — M5137 Other intervertebral disc degeneration, lumbosacral region: Secondary | ICD-10-CM | POA: Diagnosis not present

## 2020-05-13 DIAGNOSIS — M9903 Segmental and somatic dysfunction of lumbar region: Secondary | ICD-10-CM | POA: Diagnosis not present

## 2020-05-13 DIAGNOSIS — M5136 Other intervertebral disc degeneration, lumbar region: Secondary | ICD-10-CM | POA: Diagnosis not present

## 2020-06-03 DIAGNOSIS — M9903 Segmental and somatic dysfunction of lumbar region: Secondary | ICD-10-CM | POA: Diagnosis not present

## 2020-06-03 DIAGNOSIS — M5116 Intervertebral disc disorders with radiculopathy, lumbar region: Secondary | ICD-10-CM | POA: Diagnosis not present

## 2020-06-03 DIAGNOSIS — M5137 Other intervertebral disc degeneration, lumbosacral region: Secondary | ICD-10-CM | POA: Diagnosis not present

## 2020-06-03 DIAGNOSIS — M5136 Other intervertebral disc degeneration, lumbar region: Secondary | ICD-10-CM | POA: Diagnosis not present

## 2020-09-11 DIAGNOSIS — Z20828 Contact with and (suspected) exposure to other viral communicable diseases: Secondary | ICD-10-CM | POA: Diagnosis not present

## 2020-10-12 DIAGNOSIS — Z20822 Contact with and (suspected) exposure to covid-19: Secondary | ICD-10-CM | POA: Diagnosis not present

## 2020-10-12 DIAGNOSIS — Z03818 Encounter for observation for suspected exposure to other biological agents ruled out: Secondary | ICD-10-CM | POA: Diagnosis not present

## 2021-01-12 DIAGNOSIS — J029 Acute pharyngitis, unspecified: Secondary | ICD-10-CM | POA: Diagnosis not present

## 2021-01-12 DIAGNOSIS — Z20822 Contact with and (suspected) exposure to covid-19: Secondary | ICD-10-CM | POA: Diagnosis not present

## 2021-01-12 DIAGNOSIS — H66002 Acute suppurative otitis media without spontaneous rupture of ear drum, left ear: Secondary | ICD-10-CM | POA: Diagnosis not present

## 2021-01-12 DIAGNOSIS — J019 Acute sinusitis, unspecified: Secondary | ICD-10-CM | POA: Diagnosis not present

## 2021-02-25 DIAGNOSIS — N529 Male erectile dysfunction, unspecified: Secondary | ICD-10-CM | POA: Diagnosis not present

## 2021-02-25 DIAGNOSIS — M79671 Pain in right foot: Secondary | ICD-10-CM | POA: Diagnosis not present

## 2021-02-25 DIAGNOSIS — R030 Elevated blood-pressure reading, without diagnosis of hypertension: Secondary | ICD-10-CM | POA: Diagnosis not present

## 2021-02-25 DIAGNOSIS — Z0000 Encounter for general adult medical examination without abnormal findings: Secondary | ICD-10-CM | POA: Diagnosis not present

## 2021-02-28 DIAGNOSIS — Z0000 Encounter for general adult medical examination without abnormal findings: Secondary | ICD-10-CM | POA: Diagnosis not present

## 2021-02-28 DIAGNOSIS — Z125 Encounter for screening for malignant neoplasm of prostate: Secondary | ICD-10-CM | POA: Diagnosis not present

## 2021-02-28 DIAGNOSIS — Z1329 Encounter for screening for other suspected endocrine disorder: Secondary | ICD-10-CM | POA: Diagnosis not present

## 2021-02-28 DIAGNOSIS — N529 Male erectile dysfunction, unspecified: Secondary | ICD-10-CM | POA: Diagnosis not present

## 2021-02-28 DIAGNOSIS — Z13220 Encounter for screening for lipoid disorders: Secondary | ICD-10-CM | POA: Diagnosis not present

## 2021-05-30 DIAGNOSIS — D123 Benign neoplasm of transverse colon: Secondary | ICD-10-CM | POA: Diagnosis not present

## 2021-05-30 DIAGNOSIS — K648 Other hemorrhoids: Secondary | ICD-10-CM | POA: Diagnosis not present

## 2021-05-30 DIAGNOSIS — Z1211 Encounter for screening for malignant neoplasm of colon: Secondary | ICD-10-CM | POA: Diagnosis not present

## 2021-05-30 DIAGNOSIS — D124 Benign neoplasm of descending colon: Secondary | ICD-10-CM | POA: Diagnosis not present

## 2022-09-17 ENCOUNTER — Encounter (HOSPITAL_BASED_OUTPATIENT_CLINIC_OR_DEPARTMENT_OTHER): Payer: Self-pay

## 2022-09-17 ENCOUNTER — Emergency Department (HOSPITAL_BASED_OUTPATIENT_CLINIC_OR_DEPARTMENT_OTHER)
Admission: EM | Admit: 2022-09-17 | Discharge: 2022-09-17 | Disposition: A | Discharge status: Home / Self Care | Payer: BC Managed Care – PPO | Attending: Emergency Medicine | Admitting: Emergency Medicine

## 2022-09-17 ENCOUNTER — Other Ambulatory Visit: Payer: Self-pay

## 2022-09-17 DIAGNOSIS — R55 Syncope and collapse: Secondary | ICD-10-CM

## 2022-09-17 DIAGNOSIS — R42 Dizziness and giddiness: Secondary | ICD-10-CM | POA: Insufficient documentation

## 2022-09-17 LAB — BASIC METABOLIC PANEL
Anion gap: 7 (ref 5–15)
BUN: 14 mg/dL (ref 6–20)
CO2: 23 mmol/L (ref 22–32)
Calcium: 8.7 mg/dL — ABNORMAL LOW (ref 8.9–10.3)
Chloride: 105 mmol/L (ref 98–111)
Creatinine, Ser: 1.06 mg/dL (ref 0.61–1.24)
GFR, Estimated: >60 mL/min (ref >60)
Glucose, Bld: 104 mg/dL — ABNORMAL HIGH (ref 70–99)
Potassium: 3.8 mmol/L (ref 3.5–5.1)
Sodium: 135 mmol/L (ref 135–145)

## 2022-09-17 LAB — CBC
HCT: 41.5 % (ref 39.0–52.0)
Hemoglobin: 14.3 g/dL (ref 13.0–17.0)
MCH: 30.4 pg (ref 26.0–34.0)
MCHC: 34.5 g/dL (ref 30.0–36.0)
MCV: 88.3 fL (ref 80.0–100.0)
Platelets: 188 10*3/uL (ref 150–400)
RBC: 4.7 MIL/uL (ref 4.22–5.81)
RDW: 13.2 % (ref 11.5–15.5)
WBC: 7.2 10*3/uL (ref 4.0–10.5)
nRBC: 0 % (ref 0.0–0.2)

## 2022-09-17 LAB — CBG MONITORING, ED: Glucose-Capillary: 99 mg/dL (ref 70–99)

## 2022-09-17 MED ORDER — SODIUM CHLORIDE 0.9 % IV BOLUS
1000.0000 mL | Freq: Once | INTRAVENOUS | Status: AC
  Administered 2022-09-17: 1000 mL via INTRAVENOUS

## 2022-09-17 NOTE — Discharge Instructions (Signed)
You were evaluated today after a syncopal episode.  Your workup was grossly unremarkable.  This may be due to underlying dehydration.  Please be sure to drink plenty of water throughout the day.  If you experience further syncopal episodes or other life-threatening symptoms please return to the emergency department

## 2022-09-17 NOTE — ED Provider Notes (Signed)
[name] DEPT Provider Note   CSN: [id number] Arrival date & time: 09/17/22  1805     History  Chief Complaint  Patient presents with   Dizziness    [name] is a 47 y.o. male.  Patient presents the emergency room complaining of a syncopal episode.  Patient states that for the past 2 to 3 days he has been taking Mucinex and due to increased sinus congestion.  He states that he did not go to work yesterday due to feeling unwell.  He denies fevers, body aches, chills, cough, headache.  He states that today he was sitting and talking on phone and began to feel somewhat flushed.  He states he stood up to walk outside to get some fresh air and then he walked back inside he felt strange, and woke up on the floor.  He does endorse hitting the back of his head upon falling.  He does not take blood thinners.  He endorses some mild dizziness after the fall.  He states that his family has a history of stroke and he was scared that he was having a stroke.  After falling and regaining consciousness he states he had some tingling in the bilateral extremities and endorses breathing rapidly due to being nervous about the episode.  The patient states that he has not been drinking much water.  Family at bedside states that he has only had a bowl of egg drop soup for food today with very little water.  The patient was ambulatory upon arrival with a normal gait and is conscious alert and oriented x 4 with a GCS of 15.  Past medical history significant for cholecystectomy  HPI     Home Medications Prior to Admission medications   Medication Sig Start Date End Date Taking? Authorizing Provider  bismuth subsalicylate (PEPTO BISMOL) 262 MG/15ML suspension Take 15 mLs by mouth once.     [provider]  HYDROcodone-acetaminophen (NORCO/VICODIN) 5-325 MG per tablet Take 1 tablet by mouth every 4 (four) hours as needed. 04/10/13   Riebock, Estill Bakes, NP  ibuprofen (MOTRIN IB) 200 MG  tablet Take 3 tablets (600 mg total) by mouth every 8 (eight) hours as needed for pain. 04/10/13   Erby Pian, NP      Allergies    Patient has no known allergies.    Review of Systems   Review of Systems  Constitutional:  Positive for fever (Subjective).  HENT:  Positive for congestion.   Respiratory:  Positive for cough. Negative for shortness of breath.   Cardiovascular:  Negative for chest pain.  Gastrointestinal:  Negative for abdominal pain, nausea and vomiting.  Genitourinary:  Negative for dysuria.  Neurological:  Positive for dizziness (Now resolved) and syncope. Negative for facial asymmetry and speech difficulty.    Physical Exam Updated Vital Signs BP 113/69   Pulse (!) 51   Temp (!) 97.4 F (36.3 C)   Resp 20   Ht 5\' 6"  (1.676 m)   Wt 104.2 kg   SpO2 100%   BMI 37.08 kg/m  Physical Exam Vitals and nursing note reviewed.  Constitutional:      General: He is not in acute distress.    Appearance: He is well-developed.  HENT:     Head: Normocephalic and atraumatic.     Mouth/Throat:     Mouth: Mucous membranes are moist.  Eyes:     Extraocular Movements: Extraocular movements intact.     Conjunctiva/sclera: Conjunctivae normal.  Pupils: Pupils are equal, round, and reactive to light.  Cardiovascular:     Rate and Rhythm: Normal rate and regular rhythm.     Heart sounds: No murmur heard. Pulmonary:     Effort: Pulmonary effort is normal. No respiratory distress.     Breath sounds: Normal breath sounds.  Abdominal:     Palpations: Abdomen is soft.     Tenderness: There is no abdominal tenderness.  Musculoskeletal:        General: No swelling.     Cervical back: Neck supple.  Skin:    General: Skin is warm and dry.     Capillary Refill: Capillary refill takes less than 2 seconds.  Neurological:     Mental Status: He is alert.     Sensory: No sensory deficit.     Motor: No weakness.     Coordination: Coordination normal.     Gait: Gait normal.      Comments: No nerves II through VII, XI, XII intact  Psychiatric:        Mood and Affect: Mood normal.     ED Results / Procedures / Treatments   Labs (all labs ordered are listed, but only abnormal results are displayed) Labs Reviewed  BASIC METABOLIC PANEL - Abnormal; Notable for the following components:      Result Value   Glucose, Bld 104 (*)    Calcium 8.7 (*)    All other components within normal limits  CBC  CBG MONITORING, ED    EKG EKG Interpretation  Date/Time:  Thursday September 17 2022 18:14:41 EST Ventricular Rate:  61 PR Interval:  166 QRS Duration: 88 QT Interval:  414 QTC Calculation: 416 R Axis:   53 Text Interpretation: Normal sinus rhythm Normal ECG No previous ECGs available Confirmed by Blanchie Dessert [phone]) on 09/17/2022 8:15:30 PM  Radiology No results found.  Procedures Procedures    Medications Ordered in ED Medications  sodium chloride 0.9 % bolus 1,000 mL (0 mLs Intravenous Stopped 09/17/22 2036)    ED Course/ Medical Decision Making/ A&P                             Medical Decision Making Amount and/or Complexity of Data Reviewed Labs: ordered.   This patient presents to the ED for concern of syncope, this involves an extensive number of treatment options, and is a complaint that carries with it a high risk of complications and morbidity.  The differential diagnosis includes orthostatic changes, dehydration, vasovagal response, dysrhythmia, CVA, and others   Co morbidities that complicate the patient evaluation  Obesity   Additional history obtained:  Additional history obtained from family at bedside   Lab Tests:  I Ordered, and personally interpreted labs.  The pertinent results include: Grossly unremarkable BMP, CBC, CBG    Cardiac Monitoring: / EKG:  The patient was maintained on a cardiac monitor.  I personally viewed and interpreted the cardiac monitored which showed an underlying rhythm of: Normal sinus  rhythm   Problem List / ED Course / Critical interventions / Medication management   I ordered medication including normal saline bolus for fluid resuscitation Reevaluation of the patient after these medicines showed that the patient improved I have reviewed the patients home medicines and have made adjustments as needed   Test / Admission - Considered:  No dysrhythmia noted on EKG.  Patient denies any chest pain, shortness of breath, abdominal pain, nausea, vomiting.  Neuro  exam grossly normal with no signs of CVA.  Patient unable to give any history to suggest a vasovagal response at this time.  Patient does endorse taking Mucinex, not drinking much fluid at all and only consuming coffee when he does consume fluids.  Feel that the patient was likely dehydrated.  Patient was administered 1 L of saline.  Orthostatics were normal with no significant changes.  Plan to discharge patient home at this time with return precautions.        Final Clinical Impression(s) / ED Diagnoses Final diagnoses:  Syncope, unspecified syncope type    Rx / DC Orders ED Discharge Orders     None         Pamala Duffel 09/17/22 2054    Gwyneth Sprout, MD 09/17/22 2303

## 2022-09-17 NOTE — ED Triage Notes (Signed)
Patient here POV from Home.  Endorses Dizziness that began 1 Hour ago. This occurred at rest and the Patient got up to go outside when he had a syncopal Episode. Unknown Duration of LOC.   No Pain. No SOB. Possible Head Injury against Ground. No Anticoagulants.   NAD Noted during Triage. A&Ox4. GCS 15. Ambulatory.

## 2022-09-17 NOTE — ED Notes (Signed)
Pt ambulated to bathroom to attempt to give urine sample. Pt reported no dizziness when ambulating but was unable to give sample, RN notified.
